# Patient Record
Sex: Female | Born: 1970 | Race: Black or African American | Hispanic: No | Marital: Married | Smoking: Never smoker
Health system: Southern US, Community
[De-identification: ages and names within clinical notes are randomized; demographics above are authoritative.]

## PROBLEM LIST (undated history)

## (undated) DIAGNOSIS — F32A Depression, unspecified: Secondary | ICD-10-CM

## (undated) DIAGNOSIS — F419 Anxiety disorder, unspecified: Secondary | ICD-10-CM

## (undated) DIAGNOSIS — F329 Major depressive disorder, single episode, unspecified: Secondary | ICD-10-CM

## (undated) DIAGNOSIS — F3181 Bipolar II disorder: Secondary | ICD-10-CM

## (undated) HISTORY — DX: Depression, unspecified: F32.A

## (undated) HISTORY — DX: Anxiety disorder, unspecified: F41.9

## (undated) HISTORY — DX: Major depressive disorder, single episode, unspecified: F32.9

## (undated) HISTORY — DX: Bipolar II disorder: F31.81

---

## 2007-10-30 ENCOUNTER — Ambulatory Visit (HOSPITAL_COMMUNITY): Admission: RE | Admit: 2007-10-30 | Discharge: 2007-10-30 | Payer: Self-pay | Admitting: Internal Medicine

## 2007-10-31 ENCOUNTER — Ambulatory Visit (HOSPITAL_COMMUNITY): Admission: RE | Admit: 2007-10-31 | Discharge: 2007-10-31 | Payer: Self-pay | Admitting: Internal Medicine

## 2009-12-01 ENCOUNTER — Ambulatory Visit (HOSPITAL_COMMUNITY): Admission: RE | Admit: 2009-12-01 | Discharge: 2009-12-01 | Payer: Self-pay | Admitting: Obstetrics and Gynecology

## 2010-05-19 ENCOUNTER — Observation Stay (HOSPITAL_COMMUNITY): Admission: AD | Admit: 2010-05-19 | Discharge: 2010-05-20 | Payer: Self-pay | Admitting: Obstetrics and Gynecology

## 2010-08-05 ENCOUNTER — Inpatient Hospital Stay (HOSPITAL_COMMUNITY): Admission: AD | Admit: 2010-08-05 | Discharge: 2010-08-05 | Payer: Self-pay | Admitting: Obstetrics and Gynecology

## 2010-08-07 ENCOUNTER — Inpatient Hospital Stay (HOSPITAL_COMMUNITY): Admission: AD | Admit: 2010-08-07 | Discharge: 2010-08-07 | Payer: Self-pay | Admitting: Obstetrics and Gynecology

## 2010-08-07 ENCOUNTER — Inpatient Hospital Stay (HOSPITAL_COMMUNITY): Admission: AD | Admit: 2010-08-07 | Discharge: 2010-08-10 | Payer: Self-pay | Admitting: Obstetrics and Gynecology

## 2010-08-10 ENCOUNTER — Encounter: Admission: RE | Admit: 2010-08-10 | Discharge: 2010-08-11 | Payer: Self-pay | Admitting: Obstetrics and Gynecology

## 2010-09-10 ENCOUNTER — Encounter
Admission: RE | Admit: 2010-09-10 | Discharge: 2010-10-10 | Payer: Self-pay | Source: Home / Self Care | Admitting: Obstetrics and Gynecology

## 2010-10-11 ENCOUNTER — Encounter
Admission: RE | Admit: 2010-10-11 | Discharge: 2010-11-10 | Payer: Self-pay | Source: Home / Self Care | Attending: Obstetrics and Gynecology | Admitting: Obstetrics and Gynecology

## 2010-11-11 ENCOUNTER — Encounter
Admission: RE | Admit: 2010-11-11 | Discharge: 2010-12-11 | Payer: Self-pay | Source: Home / Self Care | Attending: Obstetrics and Gynecology | Admitting: Obstetrics and Gynecology

## 2010-12-12 ENCOUNTER — Encounter
Admission: RE | Admit: 2010-12-12 | Discharge: 2010-12-14 | Payer: Self-pay | Source: Home / Self Care | Attending: Obstetrics and Gynecology | Admitting: Obstetrics and Gynecology

## 2011-01-12 ENCOUNTER — Encounter (HOSPITAL_COMMUNITY)
Admission: RE | Admit: 2011-01-12 | Discharge: 2011-01-12 | Disposition: A | Discharge status: Home / Self Care | Payer: Self-pay | Source: Ambulatory Visit | Attending: Obstetrics and Gynecology | Admitting: Obstetrics and Gynecology

## 2011-01-12 DIAGNOSIS — O923 Agalactia: Secondary | ICD-10-CM | POA: Insufficient documentation

## 2011-01-27 LAB — CBC
HCT: 26.1 % — ABNORMAL LOW (ref 36.0–46.0)
HCT: 33 % — ABNORMAL LOW (ref 36.0–46.0)
Hemoglobin: 10.6 g/dL — ABNORMAL LOW (ref 12.0–15.0)
Hemoglobin: 10.8 g/dL — ABNORMAL LOW (ref 12.0–15.0)
Hemoglobin: 8.7 g/dL — ABNORMAL LOW (ref 12.0–15.0)
MCH: 26.8 pg (ref 26.0–34.0)
MCH: 27.1 pg (ref 26.0–34.0)
MCHC: 32.7 g/dL (ref 30.0–36.0)
MCV: 81.2 fL (ref 78.0–100.0)
Platelets: 206 10*3/uL (ref 150–400)
RBC: 3.9 MIL/uL (ref 3.87–5.11)
RDW: 16.2 % — ABNORMAL HIGH (ref 11.5–15.5)
RDW: 16.4 % — ABNORMAL HIGH (ref 11.5–15.5)
WBC: 5.9 10*3/uL (ref 4.0–10.5)

## 2011-01-27 LAB — TYPE AND SCREEN: Weak D: NEGATIVE

## 2011-01-30 LAB — CBC
HCT: 30.2 % — ABNORMAL LOW (ref 36.0–46.0)
HCT: 33.5 % — ABNORMAL LOW (ref 36.0–46.0)
Hemoglobin: 11 g/dL — ABNORMAL LOW (ref 12.0–15.0)
MCH: 27.9 pg (ref 26.0–34.0)
MCHC: 33.2 g/dL (ref 30.0–36.0)
MCV: 83.3 fL (ref 78.0–100.0)
Platelets: 311 10*3/uL (ref 150–400)
RBC: 4.03 MIL/uL (ref 3.87–5.11)
RDW: 13.2 % (ref 11.5–15.5)
RDW: 13.4 % (ref 11.5–15.5)
WBC: 13.5 10*3/uL — ABNORMAL HIGH (ref 4.0–10.5)

## 2011-01-30 LAB — URINALYSIS, ROUTINE W REFLEX MICROSCOPIC
Glucose, UA: NEGATIVE mg/dL
Ketones, ur: 40 mg/dL — AB
Protein, ur: NEGATIVE mg/dL
Urobilinogen, UA: 0.2 mg/dL (ref 0.0–1.0)
pH: 7 (ref 5.0–8.0)

## 2011-01-30 LAB — DIFFERENTIAL
Basophils Absolute: 0 10*3/uL (ref 0.0–0.1)
Eosinophils Absolute: 0.2 10*3/uL (ref 0.0–0.7)
Lymphs Abs: 1.2 10*3/uL (ref 0.7–4.0)
Monocytes Relative: 11 % (ref 3–12)
Neutro Abs: 8.5 10*3/uL — ABNORMAL HIGH (ref 1.7–7.7)
Neutrophils Relative %: 77 % (ref 43–77)

## 2011-01-30 LAB — BASIC METABOLIC PANEL
BUN: 4 mg/dL — ABNORMAL LOW (ref 6–23)
GFR calc Af Amer: >60 mL/min (ref >60)
Glucose, Bld: 82 mg/dL (ref 70–99)
Sodium: 134 mEq/L — ABNORMAL LOW (ref 135–145)

## 2011-01-30 LAB — URINE MICROSCOPIC-ADD ON

## 2011-02-12 ENCOUNTER — Encounter (HOSPITAL_COMMUNITY)
Admission: RE | Admit: 2011-02-12 | Discharge: 2011-02-12 | Disposition: A | Discharge status: Home / Self Care | Payer: Self-pay | Source: Ambulatory Visit | Attending: Obstetrics and Gynecology | Admitting: Obstetrics and Gynecology

## 2011-02-12 DIAGNOSIS — O923 Agalactia: Secondary | ICD-10-CM | POA: Insufficient documentation

## 2011-03-15 ENCOUNTER — Encounter (HOSPITAL_COMMUNITY)
Admission: RE | Admit: 2011-03-15 | Discharge: 2011-03-15 | Disposition: A | Discharge status: Home / Self Care | Payer: Self-pay | Source: Ambulatory Visit | Attending: Obstetrics and Gynecology | Admitting: Obstetrics and Gynecology

## 2011-03-15 DIAGNOSIS — O923 Agalactia: Secondary | ICD-10-CM | POA: Insufficient documentation

## 2011-04-05 ENCOUNTER — Encounter: Payer: Self-pay | Admitting: Family Medicine

## 2011-04-05 DIAGNOSIS — Z0289 Encounter for other administrative examinations: Secondary | ICD-10-CM

## 2011-04-15 ENCOUNTER — Encounter (HOSPITAL_COMMUNITY)
Admission: RE | Admit: 2011-04-15 | Discharge: 2011-04-15 | Disposition: A | Discharge status: Home / Self Care | Payer: BC Managed Care – PPO | Source: Ambulatory Visit | Attending: Obstetrics and Gynecology | Admitting: Obstetrics and Gynecology

## 2011-04-15 DIAGNOSIS — O923 Agalactia: Secondary | ICD-10-CM | POA: Insufficient documentation

## 2011-05-03 ENCOUNTER — Ambulatory Visit (INDEPENDENT_AMBULATORY_CARE_PROVIDER_SITE_OTHER): Payer: BC Managed Care – PPO | Admitting: Family Medicine

## 2011-05-03 ENCOUNTER — Encounter: Payer: Self-pay | Admitting: Family Medicine

## 2011-05-03 VITALS — BP 134/82 | HR 80 | Temp 98.1°F | Ht 65.5 in | Wt 198.6 lb

## 2011-05-03 DIAGNOSIS — F3181 Bipolar II disorder: Secondary | ICD-10-CM | POA: Insufficient documentation

## 2011-05-03 DIAGNOSIS — R102 Pelvic and perineal pain: Secondary | ICD-10-CM

## 2011-05-03 DIAGNOSIS — N949 Unspecified condition associated with female genital organs and menstrual cycle: Secondary | ICD-10-CM

## 2011-05-03 DIAGNOSIS — E669 Obesity, unspecified: Secondary | ICD-10-CM

## 2011-05-03 DIAGNOSIS — F3189 Other bipolar disorder: Secondary | ICD-10-CM

## 2011-05-03 DIAGNOSIS — R109 Unspecified abdominal pain: Secondary | ICD-10-CM

## 2011-05-03 DIAGNOSIS — F329 Major depressive disorder, single episode, unspecified: Secondary | ICD-10-CM

## 2011-05-03 LAB — POCT URINALYSIS DIPSTICK
Bilirubin, UA: NEGATIVE
Glucose, UA: NEGATIVE
Ketones, UA: NEGATIVE
Leukocytes, UA: NEGATIVE
Nitrite, UA: NEGATIVE
Spec Grav, UA: 1.015
Urobilinogen, UA: 0.2
pH, UA: 6

## 2011-05-03 MED ORDER — SERTRALINE HCL 50 MG PO TABS: ORAL_TABLET | ORAL | Status: DC

## 2011-05-03 NOTE — Patient Instructions (Signed)

## 2011-05-03 NOTE — Progress Notes (Signed)
  Subjective:    Patient ID: Janet Parsons, female    DOB: 24-Oct-1971, 40 y.o.   MRN: [id number]  HPI Pt here to establish.  She has hx bipolar and saw presb for psych but was discharged because she got pregnant.  Pt is looking for a new psych.   Pt also c/o some low abd pain and some urinary frequency. No fevers etc.  Baby is 37 months old.   Review of Systems    as above Objective:   Physical Exam  Constitutional: She is oriented to person, place, and time. She appears well-developed and well-nourished.  Cardiovascular: Normal rate, regular rhythm and normal heart sounds.   Pulmonary/Chest: Effort normal and breath sounds normal.  Abdominal: Soft. Bowel sounds are normal.  Musculoskeletal: She exhibits no edema.  Neurological: She is alert and oriented to person, place, and time.  Skin: Skin is warm and dry.  Psychiatric: She has a normal mood and affect. Her behavior is normal. Judgment and thought content normal.          Assessment & Plan:

## 2011-05-03 NOTE — Assessment & Plan Note (Signed)
ua neg F/u gyn

## 2011-05-03 NOTE — Assessment & Plan Note (Signed)
Start zoloft 50 mg daily Make appointment with psych rto 1 month if needed

## 2011-05-16 ENCOUNTER — Encounter (HOSPITAL_COMMUNITY)
Admission: RE | Admit: 2011-05-16 | Discharge: 2011-05-16 | Disposition: A | Discharge status: Home / Self Care | Payer: BC Managed Care – PPO | Source: Ambulatory Visit | Attending: Obstetrics and Gynecology | Admitting: Obstetrics and Gynecology

## 2011-05-16 DIAGNOSIS — O923 Agalactia: Secondary | ICD-10-CM | POA: Insufficient documentation

## 2011-06-16 ENCOUNTER — Encounter (HOSPITAL_COMMUNITY)
Admission: RE | Admit: 2011-06-16 | Discharge: 2011-06-16 | Disposition: A | Discharge status: Home / Self Care | Payer: BC Managed Care – PPO | Source: Ambulatory Visit | Attending: Obstetrics and Gynecology | Admitting: Obstetrics and Gynecology

## 2011-06-16 DIAGNOSIS — O923 Agalactia: Secondary | ICD-10-CM | POA: Insufficient documentation

## 2011-07-29 ENCOUNTER — Ambulatory Visit: Payer: BC Managed Care – PPO | Admitting: *Deleted

## 2014-06-09 DIAGNOSIS — N907 Vulvar cyst: Secondary | ICD-10-CM | POA: Insufficient documentation

## 2014-06-09 DIAGNOSIS — R110 Nausea: Secondary | ICD-10-CM | POA: Insufficient documentation

## 2014-06-09 DIAGNOSIS — R7303 Prediabetes: Secondary | ICD-10-CM | POA: Insufficient documentation

## 2014-06-25 DIAGNOSIS — Z0000 Encounter for general adult medical examination without abnormal findings: Secondary | ICD-10-CM | POA: Insufficient documentation

## 2014-06-25 DIAGNOSIS — F419 Anxiety disorder, unspecified: Secondary | ICD-10-CM | POA: Insufficient documentation

## 2015-05-01 DIAGNOSIS — R3129 Other microscopic hematuria: Secondary | ICD-10-CM | POA: Insufficient documentation

## 2015-05-19 ENCOUNTER — Other Ambulatory Visit: Payer: Self-pay | Admitting: Gastroenterology

## 2015-05-19 DIAGNOSIS — R1030 Lower abdominal pain, unspecified: Secondary | ICD-10-CM

## 2015-05-22 ENCOUNTER — Ambulatory Visit
Admission: RE | Admit: 2015-05-22 | Discharge: 2015-05-22 | Disposition: A | Discharge status: Home / Self Care | Payer: BLUE CROSS/BLUE SHIELD | Source: Ambulatory Visit | Attending: Gastroenterology | Admitting: Gastroenterology

## 2015-05-22 DIAGNOSIS — R1030 Lower abdominal pain, unspecified: Secondary | ICD-10-CM

## 2015-09-30 ENCOUNTER — Other Ambulatory Visit: Payer: Self-pay | Admitting: Obstetrics and Gynecology

## 2015-09-30 DIAGNOSIS — D259 Leiomyoma of uterus, unspecified: Secondary | ICD-10-CM

## 2015-09-30 DIAGNOSIS — D219 Benign neoplasm of connective and other soft tissue, unspecified: Secondary | ICD-10-CM

## 2015-10-15 ENCOUNTER — Ambulatory Visit
Admission: RE | Admit: 2015-10-15 | Discharge: 2015-10-15 | Disposition: A | Discharge status: Home / Self Care | Payer: BLUE CROSS/BLUE SHIELD | Source: Ambulatory Visit | Attending: Obstetrics and Gynecology | Admitting: Obstetrics and Gynecology

## 2015-10-15 ENCOUNTER — Other Ambulatory Visit (HOSPITAL_COMMUNITY): Payer: Self-pay | Admitting: Interventional Radiology

## 2015-10-15 DIAGNOSIS — D259 Leiomyoma of uterus, unspecified: Secondary | ICD-10-CM

## 2015-10-15 NOTE — Consult Note (Signed)
Chief Complaint: Patient was seen in consultation today for No chief complaint on file.  at the request of Sansing,Mary  Referring Physician(s): Sansing,Mary  History of Present Illness: Janet Parsons is a 44 y.o. female with increasing pelvic cramping and menorrhagia during her menstrual cycles. She also complains of increasing bloating and frequency. This has slowly developed over the last few years. She was diagnosed with fibroids July of this year by ultrasound. She has not been on any treatment. She denies pelvic cancer or radiation. She has no future pregnancy plans. She is gravida 1 para 67 with a 21-year-old. During her cycles, heavy days last 1-4 days. She changes pads or 5 times per day. She does have some right pelvic pain and cramping between her periods but denies any intermenstrual bleeding.  Past Medical History  Diagnosis Date  . Depression   . Anxiety   . Bipolar II disorder ([place])     History reviewed. No pertinent past surgical history.  Allergies: Review of patient's allergies indicates no known allergies.  Medications: Prior to Admission medications   Medication Sig Start Date End Date Taking? Authorizing Provider  propranolol (INDERAL) 20 MG tablet Prn anxiety/palpitations 03/19/15  Yes Historical Provider, MD     Family History  Problem Relation Age of Onset  . Hypertension Mother   . Alcohol abuse Brother   . Diabetes Brother   . Drug abuse Brother   . Alcohol abuse Sister   . Hypertension Sister   . Depression Sister   . Hypertension Sister   . Drug abuse Sister   . Alcohol abuse Brother   . Depression Brother   . Drug abuse Brother   . Alcohol abuse Brother   . Drug abuse Brother     Social History   Social History  . Marital Status: Married    Spouse Name: N/A  . Number of Children: 1  . Years of Education: 20   Occupational History  . program director---[employer]    Social History Main Topics  . Smoking  status: Never Smoker   . Smokeless tobacco: Never Used  . Alcohol Use: No  . Drug Use: No  . Sexual Activity:    Partners: Male   Other Topics Concern  . None   Social History Narrative     Review of Systems: A 12 point ROS discussed and pertinent positives are indicated in the HPI above.  All other systems are negative.  Review of Systems  Vital Signs: BP 122/90 mmHg  Pulse 69  Temp(Src) 97.9 F (36.6 C)  Resp 14  SpO2 99%  LMP 10/05/2015  Physical Exam  Constitutional: She is oriented to person, place, and time. She appears well-developed and well-nourished.  Cardiovascular: Normal rate, regular rhythm and intact distal pulses.   Pulmonary/Chest: Effort normal and breath sounds normal.  Neurological: She is alert and oriented to person, place, and time.  Skin: Skin is warm and dry.  Psychiatric: She has a normal mood and affect.     Imaging: No results found.  Ultrasound in July demonstrates multiple fibroids ranging in size from 2.3-3.5 cm.  Labs:  CBC: No results for input(s): WBC, HGB, HCT, PLT in the last 8760 hours.  COAGS: No results for input(s): INR, APTT in the last 8760 hours.  BMP: No results for input(s): NA, K, CL, CO2, GLUCOSE, BUN, CALCIUM, CREATININE, GFRNONAA, GFRAA in the last 8760 hours.  Invalid input(s): CMP   She has  not had an endometrial biopsy.   Assessment and Plan:  Mr. Donnally does have significant menstrual pain as well as bleeding and secondary symptoms such as bloating and a frequency most likely secondary to uterine fibroids. Uterine fibroid embolization was discussed. The risks, benefits, and alternatives were also discussed. Her questions were answered. She will not need an endometrial biopsy because of her age and lack of intermenstrual bleeding. She will require an MRI with contrast. She wishes to proceed with this course of action. Hopefully, if she is a candidate, we will proceed with the embolization.  Thank  you for this interesting consult.  I greatly enjoyed meeting Virgilene Jackson-Diop and look forward to participating in their care.  A copy of this report was sent to the requesting provider on this date.  Signed: Reilley Valentine, ART A 10/15/2015, 10:30 AM   I spent a total of  40 Minutes   in face to face in clinical consultation, greater than 50% of which was counseling/coordinating care for uterine fibroids.

## 2015-10-21 HISTORY — PX: DENTAL SURGERY: SHX609

## 2015-10-22 ENCOUNTER — Ambulatory Visit (HOSPITAL_COMMUNITY)
Admission: RE | Admit: 2015-10-22 | Discharge: 2015-10-22 | Disposition: A | Discharge status: Home / Self Care | Payer: BLUE CROSS/BLUE SHIELD | Source: Ambulatory Visit | Attending: Interventional Radiology | Admitting: Interventional Radiology

## 2015-10-22 ENCOUNTER — Other Ambulatory Visit (HOSPITAL_COMMUNITY): Payer: BLUE CROSS/BLUE SHIELD

## 2015-10-22 DIAGNOSIS — D259 Leiomyoma of uterus, unspecified: Secondary | ICD-10-CM | POA: Diagnosis not present

## 2015-10-22 MED ORDER — GADOBENATE DIMEGLUMINE 529 MG/ML IV SOLN
20.0000 mL | Freq: Once | INTRAVENOUS | Status: AC | PRN
  Administered 2015-10-22: 18 mL via INTRAVENOUS

## 2015-11-04 HISTORY — PX: DENTAL SURGERY: SHX609

## 2015-11-10 ENCOUNTER — Other Ambulatory Visit: Payer: Self-pay | Admitting: Radiology

## 2015-11-11 ENCOUNTER — Ambulatory Visit (HOSPITAL_COMMUNITY)
Admission: RE | Admit: 2015-11-11 | Discharge: 2015-11-11 | Disposition: A | Discharge status: Home / Self Care | Payer: BLUE CROSS/BLUE SHIELD | Source: Ambulatory Visit | Attending: Interventional Radiology | Admitting: Interventional Radiology

## 2015-11-11 ENCOUNTER — Encounter (HOSPITAL_COMMUNITY): Payer: Self-pay

## 2015-11-11 ENCOUNTER — Observation Stay (HOSPITAL_COMMUNITY)
Admission: RE | Admit: 2015-11-11 | Discharge: 2015-11-12 | Disposition: A | Discharge status: Home / Self Care | Payer: BLUE CROSS/BLUE SHIELD | Source: Ambulatory Visit | Attending: Interventional Radiology | Admitting: Interventional Radiology

## 2015-11-11 DIAGNOSIS — D259 Leiomyoma of uterus, unspecified: Principal | ICD-10-CM | POA: Insufficient documentation

## 2015-11-11 DIAGNOSIS — D219 Benign neoplasm of connective and other soft tissue, unspecified: Secondary | ICD-10-CM | POA: Diagnosis present

## 2015-11-11 LAB — CBC WITH DIFFERENTIAL/PLATELET
BASOS PCT: 1 %
Basophils Absolute: 0.1 10*3/uL (ref 0.0–0.1)
Eosinophils Absolute: 0.3 10*3/uL (ref 0.0–0.7)
Eosinophils Relative: 5 %
HEMATOCRIT: 39.9 % (ref 36.0–46.0)
HEMOGLOBIN: 12.5 g/dL (ref 12.0–15.0)
LYMPHS PCT: 41 %
Lymphs Abs: 2.4 10*3/uL (ref 0.7–4.0)
MCH: 25.8 pg — ABNORMAL LOW (ref 26.0–34.0)
MCHC: 31.3 g/dL (ref 30.0–36.0)
MCV: 82.4 fL (ref 78.0–100.0)
MONO ABS: 0.3 10*3/uL (ref 0.1–1.0)
MONOS PCT: 5 %
NEUTROS ABS: 3 10*3/uL (ref 1.7–7.7)
NEUTROS PCT: 48 %
Platelets: 393 10*3/uL (ref 150–400)
RBC: 4.84 MIL/uL (ref 3.87–5.11)
RDW: 13.1 % (ref 11.5–15.5)
WBC: 6 10*3/uL (ref 4.0–10.5)

## 2015-11-11 LAB — BASIC METABOLIC PANEL
ANION GAP: 9 (ref 5–15)
BUN: 11 mg/dL (ref 6–20)
CO2: 27 mmol/L (ref 22–32)
Calcium: 9.4 mg/dL (ref 8.9–10.3)
Chloride: 104 mmol/L (ref 101–111)
Creatinine, Ser: 0.83 mg/dL (ref 0.44–1.00)
GFR calc Af Amer: >60 mL/min (ref >60)
GFR calc non Af Amer: >60 mL/min (ref >60)
Glucose, Bld: 105 mg/dL — ABNORMAL HIGH (ref 65–99)
POTASSIUM: 4.1 mmol/L (ref 3.5–5.1)
Sodium: 140 mmol/L (ref 135–145)

## 2015-11-11 LAB — PROTIME-INR
INR: 0.99 (ref 0.00–1.49)
PROTHROMBIN TIME: 13.3 s (ref 11.6–15.2)

## 2015-11-11 LAB — GLUCOSE, CAPILLARY: Glucose-Capillary: 119 mg/dL — ABNORMAL HIGH (ref 65–99)

## 2015-11-11 LAB — HCG, SERUM, QUALITATIVE: Preg, Serum: NEGATIVE

## 2015-11-11 MED ORDER — MIDAZOLAM HCL 2 MG/2ML IJ SOLN
INTRAMUSCULAR | Status: AC | PRN
  Administered 2015-11-11 (×3): 0.5 mg via INTRAVENOUS
  Administered 2015-11-11: 1 mg via INTRAVENOUS
  Administered 2015-11-11 (×4): 0.5 mg via INTRAVENOUS

## 2015-11-11 MED ORDER — SODIUM CHLORIDE 0.9 % IV SOLN
250.0000 mL | INTRAVENOUS | Status: DC | PRN
Start: 2015-11-11 — End: 2015-11-12

## 2015-11-11 MED ORDER — LIDOCAINE HCL 1 % IJ SOLN
INTRAMUSCULAR | Status: AC
  Filled 2015-11-11: qty 20

## 2015-11-11 MED ORDER — CEFAZOLIN SODIUM-DEXTROSE 2-3 GM-% IV SOLR
INTRAVENOUS | Status: AC
  Filled 2015-11-11: qty 50

## 2015-11-11 MED ORDER — CEFAZOLIN SODIUM-DEXTROSE 2-3 GM-% IV SOLR
2.0000 g | Freq: Once | INTRAVENOUS | Status: AC
  Administered 2015-11-11: 2 g via INTRAVENOUS

## 2015-11-11 MED ORDER — NITROGLYCERIN 1 MG/10 ML FOR IR/CATH LAB
INTRA_ARTERIAL | Status: AC | PRN
  Administered 2015-11-11: 100 ug via INTRA_ARTERIAL

## 2015-11-11 MED ORDER — HYDROMORPHONE 1 MG/ML IV SOLN
INTRAVENOUS | Status: AC
  Filled 2015-11-11: qty 25

## 2015-11-11 MED ORDER — PROMETHAZINE HCL 25 MG PO TABS: 25.0000 mg | ORAL_TABLET | Freq: Three times a day (TID) | ORAL | Status: DC | PRN

## 2015-11-11 MED ORDER — ONDANSETRON HCL 4 MG/2ML IJ SOLN
4.0000 mg | Freq: Four times a day (QID) | INTRAMUSCULAR | Status: DC | PRN
  Administered 2015-11-11: 4 mg via INTRAVENOUS
  Filled 2015-11-11: qty 2

## 2015-11-11 MED ORDER — HYDROMORPHONE HCL 2 MG/ML IJ SOLN
INTRAMUSCULAR | Status: AC
  Filled 2015-11-11: qty 1

## 2015-11-11 MED ORDER — KETOROLAC TROMETHAMINE 15 MG/ML IJ SOLN
15.0000 mg | Freq: Four times a day (QID) | INTRAMUSCULAR | Status: DC
  Administered 2015-11-11 – 2015-11-12 (×3): 15 mg via INTRAVENOUS
  Filled 2015-11-11 (×7): qty 1

## 2015-11-11 MED ORDER — NALOXONE HCL 0.4 MG/ML IJ SOLN: 0.4000 mg | INTRAMUSCULAR | Status: DC | PRN

## 2015-11-11 MED ORDER — MIDAZOLAM HCL 2 MG/2ML IJ SOLN
INTRAMUSCULAR | Status: AC
  Filled 2015-11-11: qty 6

## 2015-11-11 MED ORDER — DIPHENHYDRAMINE HCL 50 MG/ML IJ SOLN: 12.5000 mg | Freq: Four times a day (QID) | INTRAMUSCULAR | Status: DC | PRN

## 2015-11-11 MED ORDER — FENTANYL CITRATE (PF) 100 MCG/2ML IJ SOLN
INTRAMUSCULAR | Status: AC | PRN
  Administered 2015-11-11: 50 ug via INTRAVENOUS
  Administered 2015-11-11 (×3): 25 ug via INTRAVENOUS

## 2015-11-11 MED ORDER — KETOROLAC TROMETHAMINE 30 MG/ML IJ SOLN
30.0000 mg | Freq: Once | INTRAMUSCULAR | Status: AC
  Administered 2015-11-11: 30 mg via INTRAVENOUS
  Filled 2015-11-11: qty 1

## 2015-11-11 MED ORDER — SODIUM CHLORIDE 0.9 % IJ SOLN
3.0000 mL | Freq: Two times a day (BID) | INTRAMUSCULAR | Status: DC
Start: 2015-11-11 — End: 2015-11-12

## 2015-11-11 MED ORDER — IOHEXOL 300 MG/ML  SOLN
45.0000 mL | Freq: Once | INTRAMUSCULAR | Status: AC | PRN
Start: 2015-11-11 — End: 2015-11-11
  Administered 2015-11-11: 45 mL via INTRA_ARTERIAL

## 2015-11-11 MED ORDER — IOHEXOL 300 MG/ML  SOLN
40.0000 mL | Freq: Once | INTRAMUSCULAR | Status: AC | PRN
  Administered 2015-11-11: 40 mL via INTRA_ARTERIAL

## 2015-11-11 MED ORDER — IOHEXOL 300 MG/ML  SOLN
20.0000 mL | Freq: Once | INTRAMUSCULAR | Status: AC | PRN
  Administered 2015-11-11: 20 mL via INTRA_ARTERIAL

## 2015-11-11 MED ORDER — HYDROMORPHONE 1 MG/ML IV SOLN
INTRAVENOUS | Status: DC
  Administered 2015-11-11: 1.8 mg via INTRAVENOUS
  Administered 2015-11-11: 12:00:00 via INTRAVENOUS
  Administered 2015-11-11: 2.14 mg via INTRAVENOUS
  Administered 2015-11-12 (×2): 1.5 mg via INTRAVENOUS
  Administered 2015-11-12: 0.9 mg via INTRAVENOUS

## 2015-11-11 MED ORDER — DIPHENHYDRAMINE HCL 12.5 MG/5ML PO ELIX
12.5000 mg | ORAL_SOLUTION | Freq: Four times a day (QID) | ORAL | Status: DC | PRN
Start: 2015-11-11 — End: 2015-11-12
  Filled 2015-11-11: qty 5

## 2015-11-11 MED ORDER — SODIUM CHLORIDE 0.9 % IJ SOLN
9.0000 mL | INTRAMUSCULAR | Status: DC | PRN
Start: 2015-11-11 — End: 2015-11-12

## 2015-11-11 MED ORDER — SODIUM CHLORIDE 0.9 % IJ SOLN
3.0000 mL | INTRAMUSCULAR | Status: DC | PRN
Start: 2015-11-11 — End: 2015-11-12

## 2015-11-11 MED ORDER — DOCUSATE SODIUM 100 MG PO CAPS
100.0000 mg | ORAL_CAPSULE | Freq: Two times a day (BID) | ORAL | Status: DC
  Administered 2015-11-11 – 2015-11-12 (×2): 100 mg via ORAL
  Filled 2015-11-11 (×3): qty 1

## 2015-11-11 MED ORDER — PROMETHAZINE HCL 25 MG RE SUPP: 25.0000 mg | Freq: Three times a day (TID) | RECTAL | Status: DC | PRN

## 2015-11-11 MED ORDER — FENTANYL CITRATE (PF) 100 MCG/2ML IJ SOLN
INTRAMUSCULAR | Status: AC
  Filled 2015-11-11: qty 4

## 2015-11-11 MED ORDER — SODIUM CHLORIDE 0.9 % IV SOLN
INTRAVENOUS | Status: DC
  Administered 2015-11-11: 08:00:00 via INTRAVENOUS

## 2015-11-11 NOTE — H&P (Signed)
    HPI: This is a 44 year old female with symptomatic uterine fibroids who has been seen in full IR consult with Dr. Barbie Banner on 10/15/15 and deemed a candidate for UFE. She has been scheduled today for image guided uterine fibroid arterial embolization. She denies any current chest pain, shortness of breath, fever, chills or urinary symptoms. She does complain of lower back ache 4/10 and denies any pelvic cramping or vaginal bleeding.   The patient has had a H&P performed within the last 30 days, all history, medications, and exam have been reviewed. The patient denies any interval changes since the H&P.  Medications: Prior to Admission medications   Medication Sig Start Date End Date Taking? Authorizing Provider  ibuprofen (ADVIL,MOTRIN) 200 MG tablet Take 200 mg by mouth every 6 (six) hours as needed.   Yes Historical Provider, MD  propranolol (INDERAL) 20 MG tablet Prn anxiety/palpitations 03/19/15  Yes Historical Provider, MD  traMADol (ULTRAM) 50 MG tablet Take 50 mg by mouth every 6 (six) hours as needed.   Yes Historical Provider, MD     Vital Signs: BP 147/98 mmHg  Pulse 88  Temp(Src) 98.6 F (37 C) (Oral)  Resp 18  SpO2 95%  LMP 10/26/2015  Physical Exam  Constitutional: She is oriented to person, place, and time. No distress.  HENT:  Head: Normocephalic and atraumatic.  Cardiovascular: Normal rate and regular rhythm.  Exam reveals no gallop and no friction rub.   No murmur heard. Pulmonary/Chest: Effort normal and breath sounds normal.  Abdominal: Soft. Bowel sounds are normal. She exhibits no distension. There is no tenderness.  Neurological: She is alert and oriented to person, place, and time.  Skin: Skin is warm and dry. She is not diaphoretic.  Psychiatric: She has a normal mood and affect. Her behavior is normal. Thought content normal.    Mallampati Score:  MD Evaluation Airway: WNL Heart: WNL Abdomen: WNL Chest/ Lungs: WNL ASA  Classification:  2 Mallampati/Airway Score: Two  Labs:  CBC:  Recent Labs  11/11/15 0805  WBC 6.0  HGB 12.5  HCT 39.9  PLT 393    COAGS:  Recent Labs  11/11/15 0805  INR 0.99    BMP:  Recent Labs  11/11/15 0805  NA 140  K 4.1  CL 104  CO2 27  GLUCOSE 105*  BUN 11  CALCIUM 9.4  CREATININE 0.83  GFRNONAA >60  GFRAA >60    Assessment/Plan:  Symptomatic uterine fibroids  Seen in full IR consult with Dr. Barbie Banner on 10/15/15 MRI reviewed, no endometrial biopsy needed per Dr. Barbie Banner secondary to age and lack of intermenstrual bleeding, no recent PAP done- scheduled for PAP in next few weeks per patient, Dr. Barbie Banner aware and ok to proceed today Scheduled today for image guided uterine fibroid arterial embolization with sedation and overnight admission The patient has been NPO, no blood thinners taken, labs and vitals have been reviewed. Risks and Benefits discussed with the patient including, but not limited to bleeding, infection, vascular injury, contrast induced renal failure and post embolization syndrome. All of the patient's questions were answered, patient is agreeable to proceed. Consent signed and in chart.   SignedHedy Jacob 11/11/2015, 9:43 AM

## 2015-11-11 NOTE — Progress Notes (Signed)
Subjective: Patient complains of 6/10 lower pelvic pain/cramping. She does c/o some nausea which zofran has helped.   Allergies: Review of patient's allergies indicates no known allergies.  Medications: Prior to Admission medications   Medication Sig Start Date End Date Taking? Authorizing Provider  chlorhexidine (PERIDEX) 0.12 % solution SWISH WITH 1/2 CAPFUL BEFORE BEDTIME AND EXPECTORATE. START 24 H AFTER SURGERY 10/21/15  Yes Historical Provider, MD  doxycycline (VIBRAMYCIN) 100 MG capsule Take 1 capsule by mouth daily. 11/04/15  Yes Historical Provider, MD  ibuprofen (ADVIL,MOTRIN) 200 MG tablet Take 200 mg by mouth every 6 (six) hours as needed for moderate pain.    Yes Historical Provider, MD  propranolol (INDERAL) 20 MG tablet Take one tablet daily as needed for palpitations/anxiety 03/19/15  Yes Historical Provider, MD  traMADol (ULTRAM) 50 MG tablet Take 50 mg by mouth every 6 (six) hours as needed.   Yes Historical Provider, MD   Vital Signs: BP 110/69 mmHg  Pulse 67  Temp(Src) 97.6 F (36.4 C) (Oral)  Resp 12  SpO2 99%  LMP 10/26/2015  Physical Exam General: Awake, oriented, NAD Abd: Soft, TTP lower pelvic region Ext: RCFA access dressing C/D/I, Soft, NT, no signs of bleeding or hematoma, DP 1+ b/l, feet warm b/l  Imaging: Ir Angiogram Pelvis Selective Or Supraselective  11/11/2015  CLINICAL DATA:  Uterine fibroids. EXAM: UTERINE ARTERY EMBOLIZATION FLUOROSCOPY TIME:  24 minutes and 30 seconds. MEDICATIONS AND MEDICAL HISTORY: Versed 4.5 mg, Fentanyl 125 mcg. As antibiotic prophylaxis, Ancef was ordered pre-procedure and administered intravenously within one hour of incision. Allergies: None ANESTHESIA/SEDATION: Moderate sedation time: 90 minutes CONTRAST:  105 cc Omnipaque 300 PROCEDURE: Vessels selected:  Third order iliac bilateral. The procedure, risks, benefits, and alternatives were explained to the patient. Questions regarding the procedure were encouraged and  answered. The patient understands and consents to the procedure. The right groin was prepped with Betadine in a sterile fashion, and a sterile drape was applied covering the operative field. A sterile gown and sterile gloves were used for the procedure. A micropuncture needle was inserted into the right common femoral artery and removed over an 018 wire which was up sized to a [employer]. A 5-French sheath was inserted. Cobra II catheter was advanced into the aorta. The contralateral left common iliac artery was selected. The internal iliac artery on the left was selected. 3D Angiography was performed. A micro catheter was advanced over an 018 glide wire into the left uterine artery. Angiography was performed. Embolization was performed utilizing 2 vials 500-700 micron embospheres. Follow-up angiogram was performed after waiting for 5 minutes. The micro catheter was removed and flushed. A Waltman's loop was created. The ipsilateral right common iliac artery and right internal iliac artery were selected. Angiography was performed. A micro catheter was advanced over a 0018 glide wire into the right uterine artery. 3D Angiography was performed. Embolization was again performed with 1-2/3 vial 500-700 micron embospheres. Follow-up angiogram was performed after waiting 5 minutes. The micro catheter and Cobra catheter were removed. Right femoral angiography was performed. An Exoseal device was deployed without complication and hemostasis was achieved. FINDINGS: Imaging demonstrates bilateral pelvic anatomy and bilateral uterine artery angiography. Expected anatomy is identified. No blood supply from the uterine artery to the vagina is visualized. Post embolization angiography demonstrates relative stasis of flow and pruning of the vasculature to the uterus. COMPLICATIONS: None IMPRESSION: Successful bilateral uterine artery embolization. Electronically Signed   By: Marybelle Killings M.D.   On: 11/11/2015 12:36  Ir Angiogram  Pelvis Selective Or Supraselective  11/11/2015  CLINICAL DATA:  Uterine fibroids. EXAM: UTERINE ARTERY EMBOLIZATION FLUOROSCOPY TIME:  24 minutes and 30 seconds. MEDICATIONS AND MEDICAL HISTORY: Versed 4.5 mg, Fentanyl 125 mcg. As antibiotic prophylaxis, Ancef was ordered pre-procedure and administered intravenously within one hour of incision. Allergies: None ANESTHESIA/SEDATION: Moderate sedation time: 90 minutes CONTRAST:  105 cc Omnipaque 300 PROCEDURE: Vessels selected:  Third order iliac bilateral. The procedure, risks, benefits, and alternatives were explained to the patient. Questions regarding the procedure were encouraged and answered. The patient understands and consents to the procedure. The right groin was prepped with Betadine in a sterile fashion, and a sterile drape was applied covering the operative field. A sterile gown and sterile gloves were used for the procedure. A micropuncture needle was inserted into the right common femoral artery and removed over an 018 wire which was up sized to a [place]. A 5-French sheath was inserted. Cobra II catheter was advanced into the aorta. The contralateral left common iliac artery was selected. The internal iliac artery on the left was selected. 3D Angiography was performed. A micro catheter was advanced over an 018 glide wire into the left uterine artery. Angiography was performed. Embolization was performed utilizing 2 vials 500-700 micron embospheres. Follow-up angiogram was performed after waiting for 5 minutes. The micro catheter was removed and flushed. A Waltman's loop was created. The ipsilateral right common iliac artery and right internal iliac artery were selected. Angiography was performed. A micro catheter was advanced over a 0018 glide wire into the right uterine artery. 3D Angiography was performed. Embolization was again performed with 1-2/3 vial 500-700 micron embospheres. Follow-up angiogram was performed after waiting 5 minutes. The micro  catheter and Cobra catheter were removed. Right femoral angiography was performed. An Exoseal device was deployed without complication and hemostasis was achieved. FINDINGS: Imaging demonstrates bilateral pelvic anatomy and bilateral uterine artery angiography. Expected anatomy is identified. No blood supply from the uterine artery to the vagina is visualized. Post embolization angiography demonstrates relative stasis of flow and pruning of the vasculature to the uterus. COMPLICATIONS: None IMPRESSION: Successful bilateral uterine artery embolization. Electronically Signed   By: Marybelle Killings M.D.   On: 11/11/2015 12:36   Ir Angiogram Selective Each Additional Vessel  11/11/2015  CLINICAL DATA:  Uterine fibroids. EXAM: UTERINE ARTERY EMBOLIZATION FLUOROSCOPY TIME:  24 minutes and 30 seconds. MEDICATIONS AND MEDICAL HISTORY: Versed 4.5 mg, Fentanyl 125 mcg. As antibiotic prophylaxis, Ancef was ordered pre-procedure and administered intravenously within one hour of incision. Allergies: None ANESTHESIA/SEDATION: Moderate sedation time: 90 minutes CONTRAST:  105 cc Omnipaque 300 PROCEDURE: Vessels selected:  Third order iliac bilateral. The procedure, risks, benefits, and alternatives were explained to the patient. Questions regarding the procedure were encouraged and answered. The patient understands and consents to the procedure. The right groin was prepped with Betadine in a sterile fashion, and a sterile drape was applied covering the operative field. A sterile gown and sterile gloves were used for the procedure. A micropuncture needle was inserted into the right common femoral artery and removed over an 018 wire which was up sized to a [place]. A 5-French sheath was inserted. Cobra II catheter was advanced into the aorta. The contralateral left common iliac artery was selected. The internal iliac artery on the left was selected. 3D Angiography was performed. A micro catheter was advanced over an 018 glide wire  into the left uterine artery. Angiography was performed. Embolization was performed utilizing 2 vials 500-700  micron embospheres. Follow-up angiogram was performed after waiting for 5 minutes. The micro catheter was removed and flushed. A Waltman's loop was created. The ipsilateral right common iliac artery and right internal iliac artery were selected. Angiography was performed. A micro catheter was advanced over a 0018 glide wire into the right uterine artery. 3D Angiography was performed. Embolization was again performed with 1-2/3 vial 500-700 micron embospheres. Follow-up angiogram was performed after waiting 5 minutes. The micro catheter and Cobra catheter were removed. Right femoral angiography was performed. An Exoseal device was deployed without complication and hemostasis was achieved. FINDINGS: Imaging demonstrates bilateral pelvic anatomy and bilateral uterine artery angiography. Expected anatomy is identified. No blood supply from the uterine artery to the vagina is visualized. Post embolization angiography demonstrates relative stasis of flow and pruning of the vasculature to the uterus. COMPLICATIONS: None IMPRESSION: Successful bilateral uterine artery embolization. Electronically Signed   By: Marybelle Killings M.D.   On: 11/11/2015 12:36   Ir Angiogram Selective Each Additional Vessel  11/11/2015  CLINICAL DATA:  Uterine fibroids. EXAM: UTERINE ARTERY EMBOLIZATION FLUOROSCOPY TIME:  24 minutes and 30 seconds. MEDICATIONS AND MEDICAL HISTORY: Versed 4.5 mg, Fentanyl 125 mcg. As antibiotic prophylaxis, Ancef was ordered pre-procedure and administered intravenously within one hour of incision. Allergies: None ANESTHESIA/SEDATION: Moderate sedation time: 90 minutes CONTRAST:  105 cc Omnipaque 300 PROCEDURE: Vessels selected:  Third order iliac bilateral. The procedure, risks, benefits, and alternatives were explained to the patient. Questions regarding the procedure were encouraged and answered. The  patient understands and consents to the procedure. The right groin was prepped with Betadine in a sterile fashion, and a sterile drape was applied covering the operative field. A sterile gown and sterile gloves were used for the procedure. A micropuncture needle was inserted into the right common femoral artery and removed over an 018 wire which was up sized to a [place]. A 5-French sheath was inserted. Cobra II catheter was advanced into the aorta. The contralateral left common iliac artery was selected. The internal iliac artery on the left was selected. 3D Angiography was performed. A micro catheter was advanced over an 018 glide wire into the left uterine artery. Angiography was performed. Embolization was performed utilizing 2 vials 500-700 micron embospheres. Follow-up angiogram was performed after waiting for 5 minutes. The micro catheter was removed and flushed. A Waltman's loop was created. The ipsilateral right common iliac artery and right internal iliac artery were selected. Angiography was performed. A micro catheter was advanced over a 0018 glide wire into the right uterine artery. 3D Angiography was performed. Embolization was again performed with 1-2/3 vial 500-700 micron embospheres. Follow-up angiogram was performed after waiting 5 minutes. The micro catheter and Cobra catheter were removed. Right femoral angiography was performed. An Exoseal device was deployed without complication and hemostasis was achieved. FINDINGS: Imaging demonstrates bilateral pelvic anatomy and bilateral uterine artery angiography. Expected anatomy is identified. No blood supply from the uterine artery to the vagina is visualized. Post embolization angiography demonstrates relative stasis of flow and pruning of the vasculature to the uterus. COMPLICATIONS: None IMPRESSION: Successful bilateral uterine artery embolization. Electronically Signed   By: Marybelle Killings M.D.   On: 11/11/2015 12:36   Ir 3d Primitivo Gauze  Darreld Mclean  11/11/2015  CLINICAL DATA:  Uterine fibroids. EXAM: UTERINE ARTERY EMBOLIZATION FLUOROSCOPY TIME:  24 minutes and 30 seconds. MEDICATIONS AND MEDICAL HISTORY: Versed 4.5 mg, Fentanyl 125 mcg. As antibiotic prophylaxis, Ancef was ordered pre-procedure and administered intravenously within one hour of incision.  Allergies: None ANESTHESIA/SEDATION: Moderate sedation time: 90 minutes CONTRAST:  105 cc Omnipaque 300 PROCEDURE: Vessels selected:  Third order iliac bilateral. The procedure, risks, benefits, and alternatives were explained to the patient. Questions regarding the procedure were encouraged and answered. The patient understands and consents to the procedure. The right groin was prepped with Betadine in a sterile fashion, and a sterile drape was applied covering the operative field. A sterile gown and sterile gloves were used for the procedure. A micropuncture needle was inserted into the right common femoral artery and removed over an 018 wire which was up sized to a [place]. A 5-French sheath was inserted. Cobra II catheter was advanced into the aorta. The contralateral left common iliac artery was selected. The internal iliac artery on the left was selected. 3D Angiography was performed. A micro catheter was advanced over an 018 glide wire into the left uterine artery. Angiography was performed. Embolization was performed utilizing 2 vials 500-700 micron embospheres. Follow-up angiogram was performed after waiting for 5 minutes. The micro catheter was removed and flushed. A Waltman's loop was created. The ipsilateral right common iliac artery and right internal iliac artery were selected. Angiography was performed. A micro catheter was advanced over a 0018 glide wire into the right uterine artery. 3D Angiography was performed. Embolization was again performed with 1-2/3 vial 500-700 micron embospheres. Follow-up angiogram was performed after waiting 5 minutes. The micro catheter and Cobra catheter  were removed. Right femoral angiography was performed. An Exoseal device was deployed without complication and hemostasis was achieved. FINDINGS: Imaging demonstrates bilateral pelvic anatomy and bilateral uterine artery angiography. Expected anatomy is identified. No blood supply from the uterine artery to the vagina is visualized. Post embolization angiography demonstrates relative stasis of flow and pruning of the vasculature to the uterus. COMPLICATIONS: None IMPRESSION: Successful bilateral uterine artery embolization. Electronically Signed   By: Marybelle Killings M.D.   On: 11/11/2015 12:36   Ir US Guide Vasc Access Right  11/11/2015  CLINICAL DATA:  Uterine fibroids. EXAM: UTERINE ARTERY EMBOLIZATION FLUOROSCOPY TIME:  24 minutes and 30 seconds. MEDICATIONS AND MEDICAL HISTORY: Versed 4.5 mg, Fentanyl 125 mcg. As antibiotic prophylaxis, Ancef was ordered pre-procedure and administered intravenously within one hour of incision. Allergies: None ANESTHESIA/SEDATION: Moderate sedation time: 90 minutes CONTRAST:  105 cc Omnipaque 300 PROCEDURE: Vessels selected:  Third order iliac bilateral. The procedure, risks, benefits, and alternatives were explained to the patient. Questions regarding the procedure were encouraged and answered. The patient understands and consents to the procedure. The right groin was prepped with Betadine in a sterile fashion, and a sterile drape was applied covering the operative field. A sterile gown and sterile gloves were used for the procedure. A micropuncture needle was inserted into the right common femoral artery and removed over an 018 wire which was up sized to a [place]. A 5-French sheath was inserted. Cobra II catheter was advanced into the aorta. The contralateral left common iliac artery was selected. The internal iliac artery on the left was selected. 3D Angiography was performed. A micro catheter was advanced over an 018 glide wire into the left uterine artery. Angiography was  performed. Embolization was performed utilizing 2 vials 500-700 micron embospheres. Follow-up angiogram was performed after waiting for 5 minutes. The micro catheter was removed and flushed. A Waltman's loop was created. The ipsilateral right common iliac artery and right internal iliac artery were selected. Angiography was performed. A micro catheter was advanced over a 0018 glide wire into the right  uterine artery. 3D Angiography was performed. Embolization was again performed with 1-2/3 vial 500-700 micron embospheres. Follow-up angiogram was performed after waiting 5 minutes. The micro catheter and Cobra catheter were removed. Right femoral angiography was performed. An Exoseal device was deployed without complication and hemostasis was achieved. FINDINGS: Imaging demonstrates bilateral pelvic anatomy and bilateral uterine artery angiography. Expected anatomy is identified. No blood supply from the uterine artery to the vagina is visualized. Post embolization angiography demonstrates relative stasis of flow and pruning of the vasculature to the uterus. COMPLICATIONS: None IMPRESSION: Successful bilateral uterine artery embolization. Electronically Signed   By: Marybelle Killings M.D.   On: 11/11/2015 12:36   Ir Embo Tumor Organ Ischemia Infarct Inc Guide Roadmapping  11/11/2015  CLINICAL DATA:  Uterine fibroids. EXAM: UTERINE ARTERY EMBOLIZATION FLUOROSCOPY TIME:  24 minutes and 30 seconds. MEDICATIONS AND MEDICAL HISTORY: Versed 4.5 mg, Fentanyl 125 mcg. As antibiotic prophylaxis, Ancef was ordered pre-procedure and administered intravenously within one hour of incision. Allergies: None ANESTHESIA/SEDATION: Moderate sedation time: 90 minutes CONTRAST:  105 cc Omnipaque 300 PROCEDURE: Vessels selected:  Third order iliac bilateral. The procedure, risks, benefits, and alternatives were explained to the patient. Questions regarding the procedure were encouraged and answered. The patient understands and consents to  the procedure. The right groin was prepped with Betadine in a sterile fashion, and a sterile drape was applied covering the operative field. A sterile gown and sterile gloves were used for the procedure. A micropuncture needle was inserted into the right common femoral artery and removed over an 018 wire which was up sized to a [place]. A 5-French sheath was inserted. Cobra II catheter was advanced into the aorta. The contralateral left common iliac artery was selected. The internal iliac artery on the left was selected. 3D Angiography was performed. A micro catheter was advanced over an 018 glide wire into the left uterine artery. Angiography was performed. Embolization was performed utilizing 2 vials 500-700 micron embospheres. Follow-up angiogram was performed after waiting for 5 minutes. The micro catheter was removed and flushed. A Waltman's loop was created. The ipsilateral right common iliac artery and right internal iliac artery were selected. Angiography was performed. A micro catheter was advanced over a 0018 glide wire into the right uterine artery. 3D Angiography was performed. Embolization was again performed with 1-2/3 vial 500-700 micron embospheres. Follow-up angiogram was performed after waiting 5 minutes. The micro catheter and Cobra catheter were removed. Right femoral angiography was performed. An Exoseal device was deployed without complication and hemostasis was achieved. FINDINGS: Imaging demonstrates bilateral pelvic anatomy and bilateral uterine artery angiography. Expected anatomy is identified. No blood supply from the uterine artery to the vagina is visualized. Post embolization angiography demonstrates relative stasis of flow and pruning of the vasculature to the uterus. COMPLICATIONS: None IMPRESSION: Successful bilateral uterine artery embolization. Electronically Signed   By: Marybelle Killings M.D.   On: 11/11/2015 12:36    Labs:  CBC:  Recent Labs  11/11/15 0805  WBC 6.0  HGB  12.5  HCT 39.9  PLT 393    COAGS:  Recent Labs  11/11/15 0805  INR 0.99    BMP:  Recent Labs  11/11/15 0805  NA 140  K 4.1  CL 104  CO2 27  GLUCOSE 105*  BUN 11  CALCIUM 9.4  CREATININE 0.83  GFRNONAA >60  GFRAA >60    Assessment and Plan: Symptomatic uterine fibroids S/p successful bilateral uterine artery embolization today Admit overnight for observation and pain control  D/C foley as ordered Discharge in am if stable   Signed: Hedy Jacob 11/11/2015, 4:14 PM

## 2015-11-11 NOTE — Sedation Documentation (Signed)
5Fr sheath was removed from right fem artery by Dr. Barbie Banner. Hemostasis achieved using exoseal closure devise and manual pressure held by Dr. Barbie Banner for 5 minutes. RDP +2, RPT +2, groin level 0.

## 2015-11-11 NOTE — Procedures (Signed)
B UAE No comp/EBL 

## 2015-11-11 NOTE — Discharge Instructions (Signed)
Uterine Artery Embolization for Fibroids, Care After °Refer to this sheet in the next few weeks. These instructions provide you with information on caring for yourself after your procedure. Your health care provider may also give you more specific instructions. Your treatment has been planned according to current medical practices, but problems sometimes occur. Call your health care provider if you have any problems or questions after your procedure. °WHAT TO EXPECT AFTER THE PROCEDURE °After your procedure, it is typical to have cramping in the pelvis. You will be given pain medicine to control it. °HOME CARE INSTRUCTIONS °· Only take over-the-counter or prescription medicines for pain, discomfort, or fever as directed by your health care provider. °· Do not take aspirin. It can cause bleeding. °· Follow your health care provider's advice regarding medicines given to you, diet, activity, and when to begin sexual activity. °· See your health care provider for follow-up care as directed. °SEEK MEDICAL CARE IF: °· You have a fever. °· You have redness, swelling, and pain around your incision site. °· You have pus draining from your incision. °· You have a rash. °SEEK IMMEDIATE MEDICAL CARE IF: °· You have bleeding from your incision site. °· You have difficulty breathing. °· You have chest pain. °· You have severe abdominal pain. °· You have leg pain. °· You become dizzy and faint. °  °This information is not intended to replace advice given to you by your health care provider. Make sure you discuss any questions you have with your health care provider. °  °Document Released: 08/21/2013 Document Reviewed: 08/21/2013 °Elsevier Interactive Patient Education ©2016 Else[employer] Inc[employer] °Uterine Artery Embolization for Fibroids °Uterine artery embolization is a nonsurgical treatment to shrink fibroids. A thin plastic tube (catheter) is used to inject material that blocks off the blood supply to the fibroid, which causes the  fibroid to shrink. °LET YOUR HEALTH CARE PROVIDER KNOW ABOUT: °· Any allergies you have. °· All medicines you are taking, including vitamins, herbs, eye drops, creams, and over-the-counter medicines. °· Previous problems you or members of your family have had with the use of anesthetics. °· Any blood disorders you have. °· Previous surgeries you have had. °· Medical conditions you have. °RISKS AND COMPLICATIONS °· Injury to the uterus from decreased blood supply °· Infection. °· Blood infection (septicemia). °· Lack of menstrual periods (amenorrhea). °· Death of tissue cells (necrosis) around your bladder or vulva. °· Development of a hole between organs or from an organ to the surface of your skin (fistula). °· Blood clot in the legs (deep vein thrombosis) or lung (pulmonary embolus). °BEFORE THE PROCEDURE °· Ask your health care provider about changing or stopping your regular medicines.   °· Do not take aspirin or blood thinners (anticoagulants) for 1 week before the surgery or as directed by your health care provider. °· Do not eat or drink anything for 8 hours before the surgery or as directed by your health care provider.   °· Empty your bladder before the procedure begins. °PROCEDURE  °· An IV tube will be placed into one of your veins. This will be used to give you a sedative and pain medication (conscious sedation). °· You will be given a medicine that numbs the area (local anesthetic). °· A small cut will be made in your groin. A catheter is then inserted into the main artery of your leg. °· The catheter will be guided through the artery to your uterus. A series of images will be taken while dye is injected   through the catheter in your groin. X-rays are taken at the same time. This is done to provide a road map of the blood supply to your uterus and fibroids. °· Tiny plastic spheres, about the size of sand grains, will be injected through the catheter. Metal coils may be used to help block the artery. The  particles will lodge in tiny branches of the uterine artery that supplies blood to the fibroids. °· The procedure is repeated on the artery that supplies the other side of the uterus. °· The catheter is then removed and pressure is held to stop any bleeding. No stitches are needed. °· A dressing is then placed over the cut (incision). °AFTER THE PROCEDURE °· You will be taken to a recovery area where your progress will be monitored until you are awake, stable, and taking fluids well. If there are no other problems, you will then be moved to a regular hospital room. °· You will be observed overnight in the hospital. °· You will have cramping that should be controlled with pain medication. °  °This information is not intended to replace advice given to you by your health care provider. Make sure you discuss any questions you have with your health care provider. °  °Document Released: 01/16/2006 Document Revised: 08/21/2013 Document Reviewed: 05/16/2013 °Elsevier Interactive Patient Education ©2016 Elsevier Inc. ° °

## 2015-11-12 ENCOUNTER — Other Ambulatory Visit: Payer: Self-pay | Admitting: Radiology

## 2015-11-12 DIAGNOSIS — D259 Leiomyoma of uterus, unspecified: Secondary | ICD-10-CM | POA: Diagnosis not present

## 2015-11-12 MED ORDER — MAGNESIUM HYDROXIDE 400 MG/5ML PO SUSP
15.0000 mL | Freq: Every day | ORAL | Status: DC | PRN
  Administered 2015-11-12: 15 mL via ORAL
  Filled 2015-11-12: qty 30

## 2015-11-12 MED ORDER — ONDANSETRON HCL 4 MG PO TABS: 4.0000 mg | ORAL_TABLET | Freq: Three times a day (TID) | ORAL | Status: AC | PRN

## 2015-11-12 MED ORDER — DOCUSATE SODIUM 100 MG PO CAPS: 100.0000 mg | ORAL_CAPSULE | Freq: Two times a day (BID) | ORAL | Status: AC

## 2015-11-12 MED ORDER — HYDROCODONE-ACETAMINOPHEN 5-325 MG PO TABS: 1.0000 | ORAL_TABLET | ORAL | Status: AC | PRN

## 2015-11-12 MED ORDER — IBUPROFEN 600 MG PO TABS: 600.0000 mg | ORAL_TABLET | Freq: Four times a day (QID) | ORAL | Status: AC | PRN

## 2015-11-12 MED ORDER — MAGNESIUM HYDROXIDE 400 MG/5ML PO SUSP
15.0000 mL | Freq: Every day | ORAL | Status: AC | PRN
Start: 2015-11-12 — End: ?

## 2015-11-12 MED ORDER — HYDROCODONE-ACETAMINOPHEN 5-325 MG PO TABS
1.0000 | ORAL_TABLET | ORAL | Status: DC | PRN
  Administered 2015-11-12: 1 via ORAL
  Filled 2015-11-12: qty 1

## 2015-11-12 NOTE — Discharge Summary (Signed)
Patient ID: Janet Parsons MRN: [id number] DOB/AGE: May 01, 1971 44 y.o.  Admit date: 11/11/2015 Discharge date: 11/12/2015  Admission Diagnoses: Symptomatic uterine fibroids   Discharge Diagnoses:  Active Problems:   Fibroids S/p successful uterine fibroid arterial embolization  Discharged Condition: Good, stable.   Hospital Course: Janet Parsons is a 44 y.o. female with increasing pelvic cramping and menorrhagia during her menstrual cycles. She also complains of increasing bloating and frequency. This has slowly developed over the last few years. She was diagnosed with fibroids July of this year by ultrasound. She was seen in full IR consult on 10/15/15 and deemed a candidate for uterine fibroid arterial embolization.   She underwent successful bilateral uterine artery embolization on 11/11/15 without any complications. She was admitted overnight for observation and pain control. Her vitals remained stable and she is afebrile. She has had her foley catheter removed and has urinated on her own without difficulty. She has ambulated without lightheadedness or dizziness. She denies any chest pain or shortness of breath. She denies any right groin access site pain, swelling or bleeding. She has tolerated a regular diet and oral medication without any current complaints of pelvic pain. She states she is ready for discharge home. She has been seen and examined and deemed medically stable for discharge. She was given the below instructions and prescriptions and verbalizes understanding.   Treatments: Successful uterine fibroid arterial embolization  Discharge Exam: Blood pressure 126/92, pulse 72, temperature 97.7 F (36.5 C), temperature source Oral, resp. rate 18, height 5\' 4"  (1.626 m), weight 192 lb (87.091 kg), last menstrual period 10/26/2015, SpO2 100 %.  Physical Exam: General: A&Ox3, NAD Heart: RRR without M/G/R Lungs: CTA b/l Abd: Soft, NT, ND, (+) BS Ext: RCFA access  dressing C/D/I, soft, NT, no signs of bleeding or hematoma, DP 1+ b/l, LE warm b/l, no edema  Disposition:   Discharge Instructions    Call MD for:  difficulty breathing, headache or visual disturbances    Complete by:  As directed      Call MD for:  extreme fatigue    Complete by:  As directed      Call MD for:  hives    Complete by:  As directed      Call MD for:  persistant dizziness or light-headedness    Complete by:  As directed      Call MD for:  persistant nausea and vomiting    Complete by:  As directed      Call MD for:  redness, tenderness, or signs of infection (pain, swelling, redness, odor or green/yellow discharge around incision site)    Complete by:  As directed      Call MD for:  severe uncontrolled pain    Complete by:  As directed      Call MD for:  temperature >100.4    Complete by:  As directed      Diet - low sodium heart healthy    Complete by:  As directed      Driving Restrictions    Complete by:  As directed   No driving x 2 days     Increase activity slowly    Complete by:  As directed      Lifting restrictions    Complete by:  As directed   No lifting over 20 lbs x 3 days     Remove dressing in 24 hours    Complete by:  As directed  Medication List    TAKE these medications        chlorhexidine 0.12 % solution  Commonly known as:  PERIDEX  SWISH WITH 1/2 CAPFUL BEFORE BEDTIME AND EXPECTORATE. START 24 H AFTER SURGERY     docusate sodium 100 MG capsule  Commonly known as:  COLACE  Take 1 capsule (100 mg total) by mouth 2 (two) times daily.     doxycycline 100 MG capsule  Commonly known as:  VIBRAMYCIN  Take 1 capsule by mouth daily.     HYDROcodone-acetaminophen 5-325 MG tablet  Commonly known as:  NORCO/VICODIN  Take 1-2 tablets by mouth every 4 (four) hours as needed for moderate pain.     ibuprofen 600 MG tablet  Commonly known as:  ADVIL,MOTRIN  Take 1 tablet (600 mg total) by mouth every 6 (six) hours as needed for  moderate pain or cramping.     magnesium hydroxide 400 MG/5ML suspension  Commonly known as:  MILK OF MAGNESIA  Take 15 mLs by mouth daily as needed for mild constipation.     ondansetron 4 MG tablet  Commonly known as:  ZOFRAN  Take 1 tablet (4 mg total) by mouth every 8 (eight) hours as needed for nausea or vomiting.     propranolol 20 MG tablet  Commonly known as:  INDERAL  Take one tablet daily as needed for palpitations/anxiety     traMADol 50 MG tablet  Commonly known as:  ULTRAM  Take 50 mg by mouth every 6 (six) hours as needed.           Follow-up Information    Follow up with HOSS, ART A, MD.   Specialty:  Interventional Radiology   Why:  Our office will call with appointment [phone]   Contact information:   [address] [place] [address] [id number]        Signed: Hedy Jacob 11/12/2015, 11:06 AM   I have spent Greater Than 30 Minutes discharging [place].

## 2015-11-12 NOTE — Progress Notes (Signed)
Assessment unchanged. Pt verbalized understanding of dc instructions through teach back regarding follow up care and when to call the doctor. Scripts x 5 given as provided by Gus Height.  Discharged via wc to front entrance to meet awaiting vehicle to carry home. Accompanied by husband and NT.

## 2015-11-14 ENCOUNTER — Telehealth: Payer: Self-pay | Admitting: Radiology

## 2015-11-14 NOTE — Progress Notes (Signed)
Pt called last pm and talked with Dr Kathlene Cote Having a  probable reaction to Vicodin Noted itching and shortness of breath Pt admits sob my have been more panic  Advised to stop Vicodin per Dr Kathlene Cote Has been using Ibuprofen 600 mg 4x/day Has Tramadol at home from a previous procedure-- if needed  Feels so much better today All is well Will start walking for exercise today Does complain of constipation Walking will help Push fluids.....  She is to call us if she needs anything

## 2015-11-23 ENCOUNTER — Other Ambulatory Visit (HOSPITAL_COMMUNITY): Payer: Self-pay | Admitting: Interventional Radiology

## 2015-11-23 DIAGNOSIS — D259 Leiomyoma of uterus, unspecified: Secondary | ICD-10-CM

## 2015-12-15 ENCOUNTER — Ambulatory Visit
Admission: RE | Admit: 2015-12-15 | Discharge: 2015-12-15 | Disposition: A | Discharge status: Home / Self Care | Payer: BLUE CROSS/BLUE SHIELD | Source: Ambulatory Visit | Attending: Interventional Radiology | Admitting: Interventional Radiology

## 2015-12-15 DIAGNOSIS — D259 Leiomyoma of uterus, unspecified: Secondary | ICD-10-CM

## 2015-12-15 HISTORY — PX: IR GENERIC HISTORICAL: IMG1180011

## 2015-12-15 NOTE — Progress Notes (Signed)
Chief Complaint: Patient was seen in consultation today for No chief complaint on file.  at the request of Graylon Amory  Referring Physician(s): Johndaniel Catlin  History of Present Illness: Janet Parsons is a 45 y.o. female who is 4 weeks status post uterine artery embolization. During her overnight hospital extended outpatient stay, her course was uneventful. She did experience an allergic reaction to Vicodin after discharge which soon resolved. She has had little to no menstrual bleeding since her discharge. Her pain was minimal, lasting 3-4 days. She has been feeling well and has no present complaints other than continued constipation. She denies any fevers or chills. She denies any foul discharge. She does express a little discomfort in her right groin at her angiogram site.  Past Medical History  Diagnosis Date  . Depression   . Anxiety   . Bipolar II disorder [facility])     Past Surgical History  Procedure Laterality Date  . Dental surgery Left 10/21/2015  . Dental surgery Right 11/04/15    Allergies: Review of patient's allergies indicates no known allergies.  Medications: Prior to Admission medications   Medication Sig Start Date End Date Taking? Authorizing Provider  chlorhexidine (PERIDEX) 0.12 % solution SWISH WITH 1/2 CAPFUL BEFORE BEDTIME AND EXPECTORATE. START 24 H AFTER SURGERY 10/21/15   Historical Provider, MD  docusate sodium (COLACE) 100 MG capsule Take 1 capsule (100 mg total) by mouth 2 (two) times daily. 11/12/15   Hedy Jacob, PA-C  doxycycline (VIBRAMYCIN) 100 MG capsule Take 1 capsule by mouth daily. 11/04/15   Historical Provider, MD  HYDROcodone-acetaminophen (NORCO/VICODIN) 5-325 MG tablet Take 1-2 tablets by mouth every 4 (four) hours as needed for moderate pain. 11/12/15   Hedy Jacob, PA-C  ibuprofen (ADVIL,MOTRIN) 600 MG tablet Take 1 tablet (600 mg total) by mouth every 6 (six) hours as needed for moderate pain or cramping. 11/12/15   Hedy Jacob, PA-C  magnesium hydroxide (MILK OF MAGNESIA) 400 MG/5ML suspension Take 15 mLs by mouth daily as needed for mild constipation. 11/12/15   Hedy Jacob, PA-C  ondansetron (ZOFRAN) 4 MG tablet Take 1 tablet (4 mg total) by mouth every 8 (eight) hours as needed for nausea or vomiting. 11/12/15   Hedy Jacob, PA-C  propranolol (INDERAL) 20 MG tablet Take one tablet daily as needed for palpitations/anxiety 03/19/15   Historical Provider, MD  traMADol (ULTRAM) 50 MG tablet Take 50 mg by mouth every 6 (six) hours as needed.    Historical Provider, MD     Family History  Problem Relation Age of Onset  . Hypertension Mother   . Alcohol abuse Brother   . Diabetes Brother   . Drug abuse Brother   . Alcohol abuse Sister   . Hypertension Sister   . Depression Sister   . Hypertension Sister   . Drug abuse Sister   . Alcohol abuse Brother   . Depression Brother   . Drug abuse Brother   . Alcohol abuse Brother   . Drug abuse Brother     Social History   Social History  . Marital Status: Married    Spouse Name: N/A  . Number of Children: 1  . Years of Education: 20   Occupational History  . program director---[employer]    Social History Main Topics  . Smoking status: Never Smoker   . Smokeless tobacco: Never Used  . Alcohol Use: No  . Drug Use: No  . Sexual Activity:  Partners: Male   Other Topics Concern  . Not on file   Social History Narrative     Review of Systems: A 12 point ROS discussed and pertinent positives are indicated in the HPI above.  All other systems are negative.  Review of Systems  Vital Signs: There were no vitals taken for this visit.  Physical Exam  Constitutional: She appears well-developed and well-nourished.  Musculoskeletal:  Examination of the right groin demonstrates no evidence of mass or bruising. Pulses are intact distally.    Mallampati Score:     Imaging: No results found.  Labs:  CBC:  Recent  Labs  11/11/15 0805  WBC 6.0  HGB 12.5  HCT 39.9  PLT 393    COAGS:  Recent Labs  11/11/15 0805  INR 0.99    BMP:  Recent Labs  11/11/15 0805  NA 140  K 4.1  CL 104  CO2 27  GLUCOSE 105*  BUN 11  CALCIUM 9.4  CREATININE 0.83  GFRNONAA >60  GFRAA >60    LIVER FUNCTION TESTS: No results for input(s): BILITOT, AST, ALT, ALKPHOS, PROT, ALBUMIN in the last 8760 hours.  TUMOR MARKERS: No results for input(s): AFPTM, CEA, CA199, CHROMGRNA in the last 8760 hours.  Assessment and Plan:  Mr. Slesinski has done very well after uterine artery embolization. She has had no complication. She was reassured that continued shrinkage of the fibroids would occur. We will contact her in 2 months and see her in 5 months in follow-up.  Thank you for this interesting consult.  I greatly enjoyed meeting Janet Parsons and look forward to participating in their care.  A copy of this report was sent to the requesting provider on this date.  Electronically Signed: Angie Hogg, ART A 12/15/2015, 3:01 PM   I spent a total of  15 Minutes in face to face in clinical consultation, greater than 50% of which was counseling/coordinating care for uterine fibroid embolization.

## 2016-02-10 ENCOUNTER — Telehealth: Payer: Self-pay | Admitting: Radiology

## 2016-02-10 NOTE — Telephone Encounter (Signed)
3 mo s/p [place] call for update.  Left message on patient's voice mail requesting a return call.  Niyah Mamaril Riki Rusk, RN 02/10/2016 2:16 PM

## 2016-02-16 ENCOUNTER — Telehealth: Payer: Self-pay | Admitting: Radiology

## 2016-02-16 NOTE — Telephone Encounter (Signed)
Returning patient's call.  Left message on voice mail.  Damaris Geers Tensed, RN 02/16/2016 11:11 AM

## 2016-03-08 ENCOUNTER — Telehealth: Payer: Self-pay | Admitting: Radiology

## 2016-03-08 NOTE — Telephone Encounter (Addendum)
Patient called for 4 mo s/p [place] update.  Patient reports that she did not have a menstrual cycle for approx 2 mos post [place].  LMP:  02/24/2016.  Decreased flow.  Some cramping with cycle. Bulk Sx:  Bloating, increased w/ cycle.    Patient prefers to schedule follow up at 6 mo s/p [place].  To be scheduled for June 2017.  The following has bee faxed to Dr. Rolan Lipa per patient request:  MR report (10/22/2015); [place] report (11/11/2015), 1 mo follow up appointment note (12/15/2015).  Sovereign Ramiro Riki Rusk, RN 03/08/2016 10:26 AM

## 2016-04-14 ENCOUNTER — Other Ambulatory Visit (HOSPITAL_COMMUNITY): Payer: Self-pay | Admitting: Interventional Radiology

## 2016-04-14 DIAGNOSIS — D259 Leiomyoma of uterus, unspecified: Secondary | ICD-10-CM

## 2016-05-03 ENCOUNTER — Encounter: Payer: Self-pay | Admitting: Radiology

## 2016-09-16 ENCOUNTER — Other Ambulatory Visit (HOSPITAL_COMMUNITY): Payer: Self-pay | Admitting: Interventional Radiology

## 2016-09-16 DIAGNOSIS — D259 Leiomyoma of uterus, unspecified: Secondary | ICD-10-CM

## 2016-10-13 ENCOUNTER — Ambulatory Visit (HOSPITAL_COMMUNITY)
Admission: RE | Admit: 2016-10-13 | Discharge: 2016-10-13 | Disposition: A | Discharge status: Home / Self Care | Payer: BLUE CROSS/BLUE SHIELD | Source: Ambulatory Visit | Attending: Interventional Radiology | Admitting: Interventional Radiology

## 2016-10-13 ENCOUNTER — Encounter (HOSPITAL_COMMUNITY): Payer: Self-pay

## 2016-10-20 ENCOUNTER — Inpatient Hospital Stay: Admission: RE | Admit: 2016-10-20 | Payer: BLUE CROSS/BLUE SHIELD | Source: Ambulatory Visit

## 2016-11-18 ENCOUNTER — Encounter: Payer: Self-pay | Admitting: Interventional Radiology

## 2017-02-25 IMAGING — MR MR PELVIS WO/W CM
4 of 9 series · 19 of 48 positions shown · IV contrast (Yes)
Comparison: Pelvic ultrasound dated 05/22/2015

CLINICAL DATA: Uterine fibroids

EXAM:
MRI PELVIS WITHOUT AND WITH CONTRAST
TECHNIQUE: Multiplanar multisequence MR imaging of the pelvis was performed
both before and after administration of intravenous contrast.
CONTRAST:  18mL MULTIHANCE GADOBENATE DIMEGLUMINE 529 MG/ML IV SOLN

[Series 3: T2 · sagittal · 5.0mm · 0.47mm/px · 5 of 23 slices shown (1 of 3)]
[im 1/23]
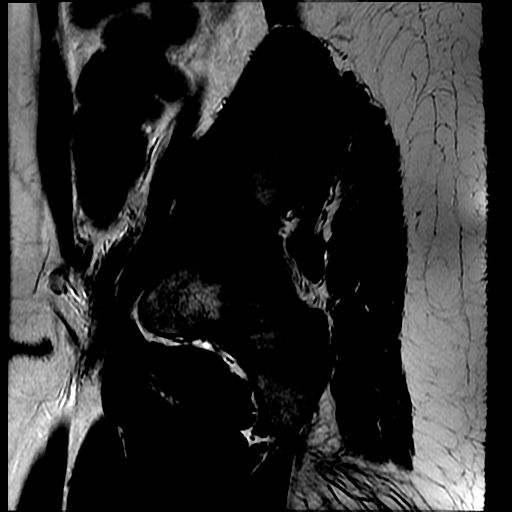
[im 6/23]
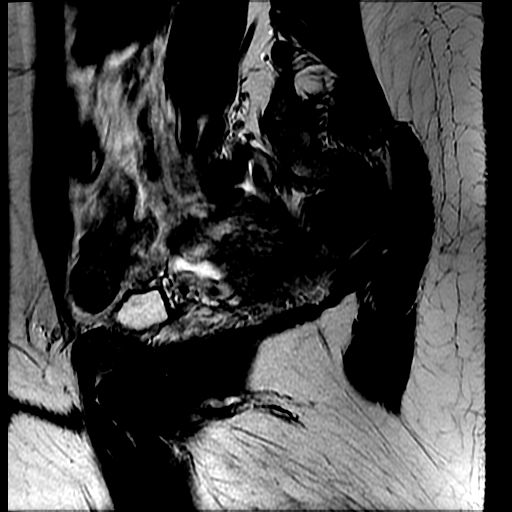
[im 12/23]
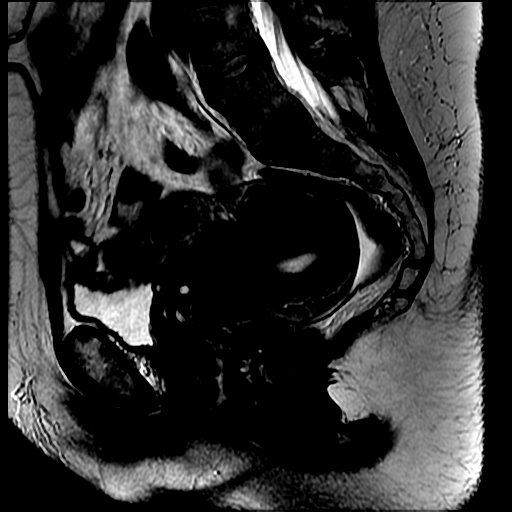
[im 17/23]
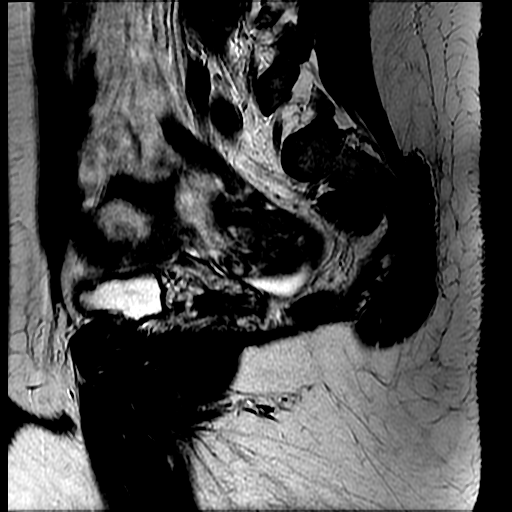
[im 23/23]
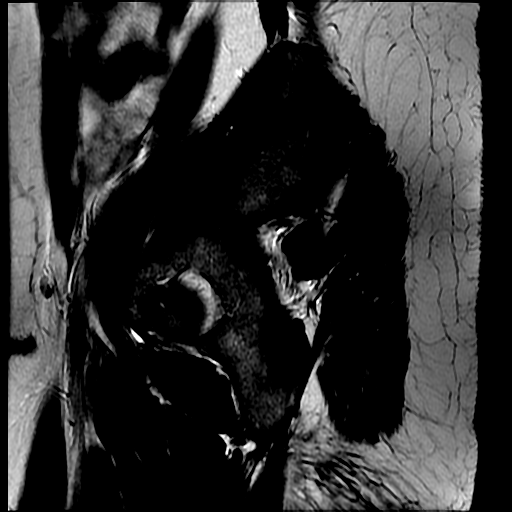

[Series 4: T1 · sagittal · 5.0mm · 0.47mm/px · 5 of 23 slices shown]
[im 1/23]
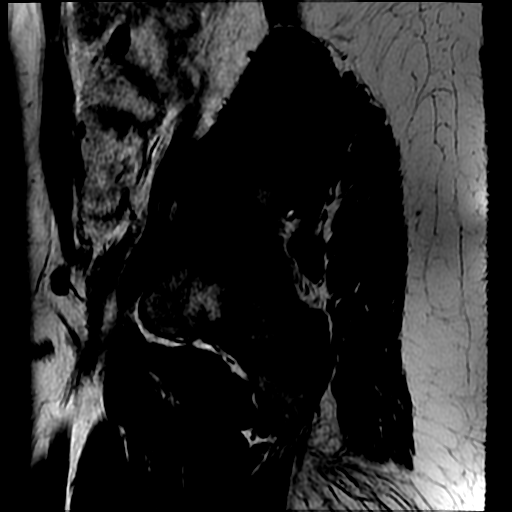
[im 6/23]
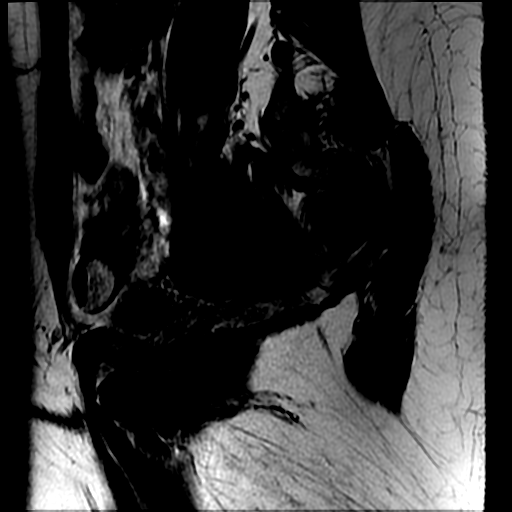
[im 12/23]
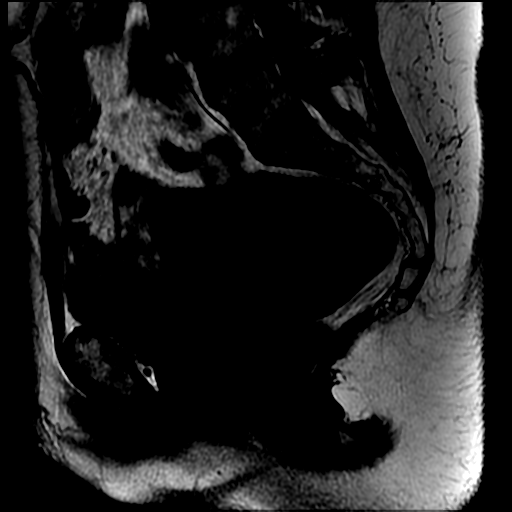
[im 17/23]
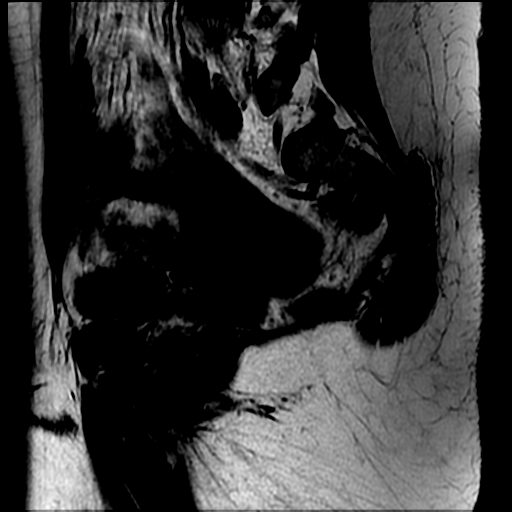
[im 23/23]
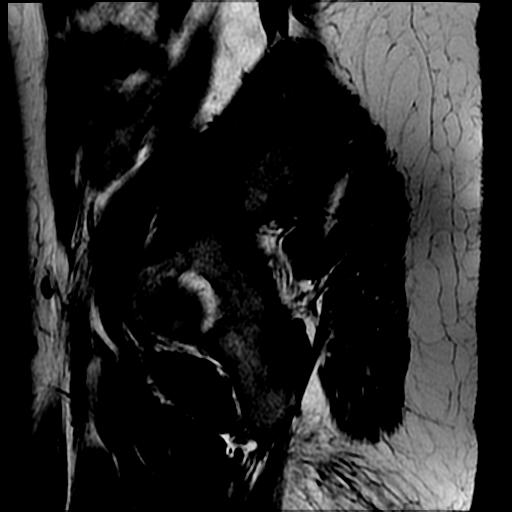

[Series 5: T2 · axial · 5.0mm · 0.47mm/px · z∈[-118,+98]mm · 6 of 37 slices shown (2 of 3)]
[im 1/37]
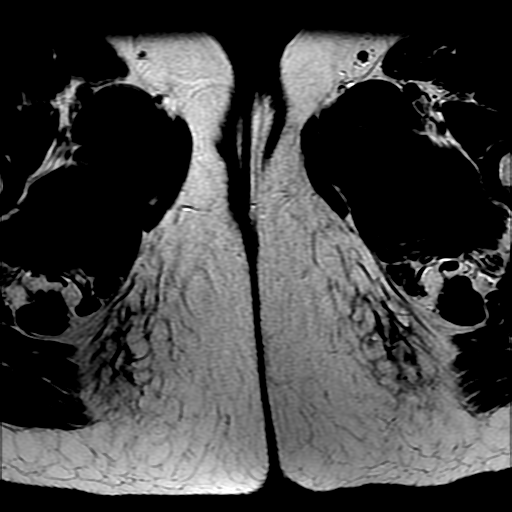
[im 8/37]
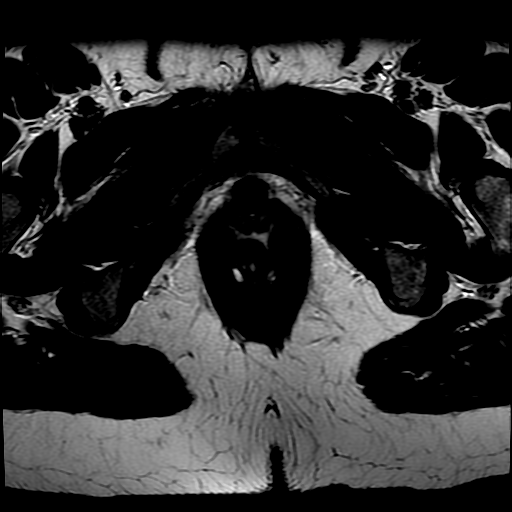
[im 15/37]
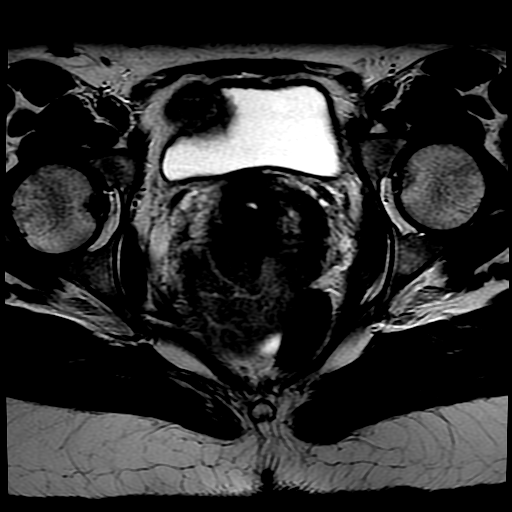
[im 22/37]
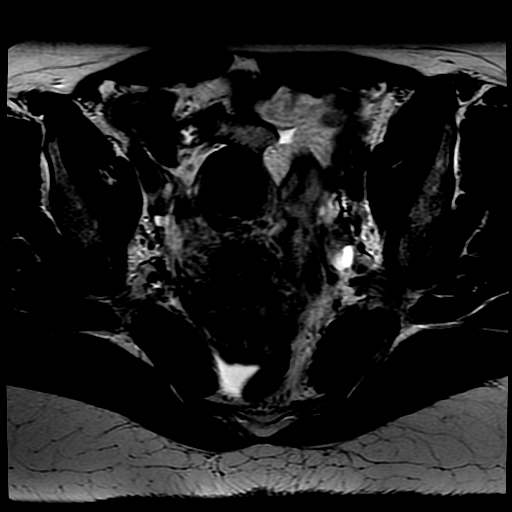
[im 29/37]
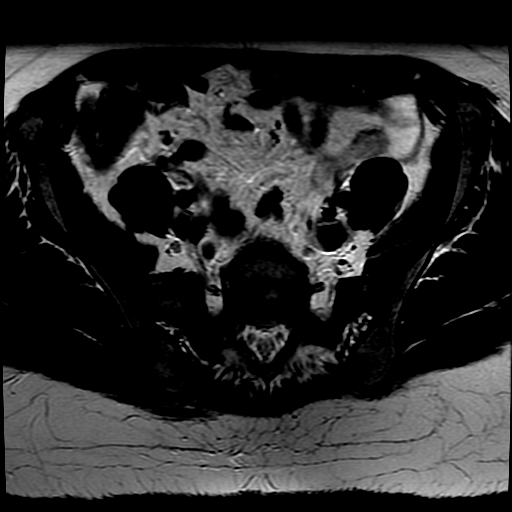
[im 37/37]
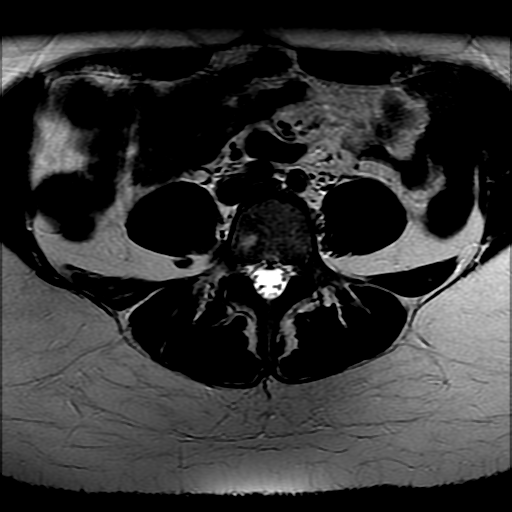

[Series 6: T2 · coronal · 5.0mm · 0.47mm/px · 3 of 32 slices shown (3 of 3)]
[im 1/32]
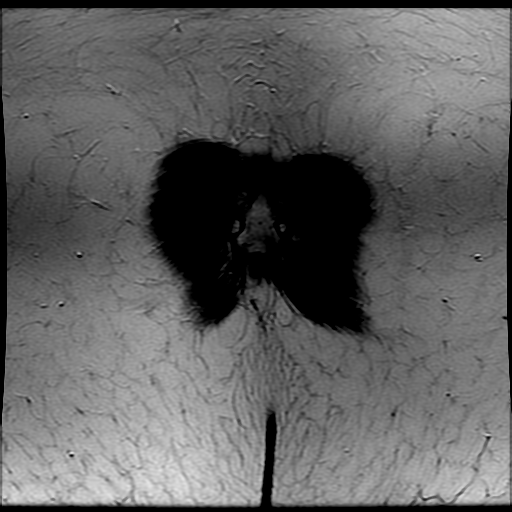
[im 16/32]
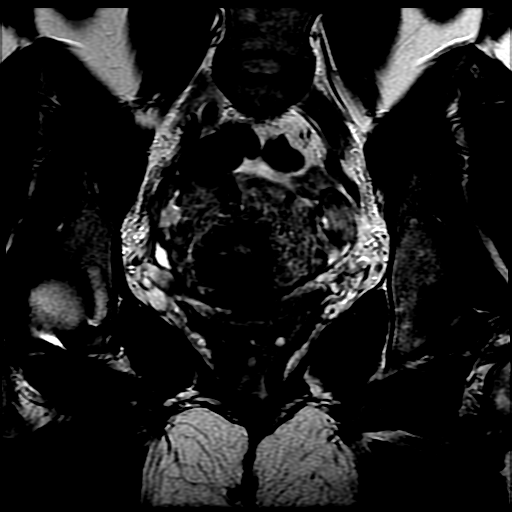
[im 32/32]
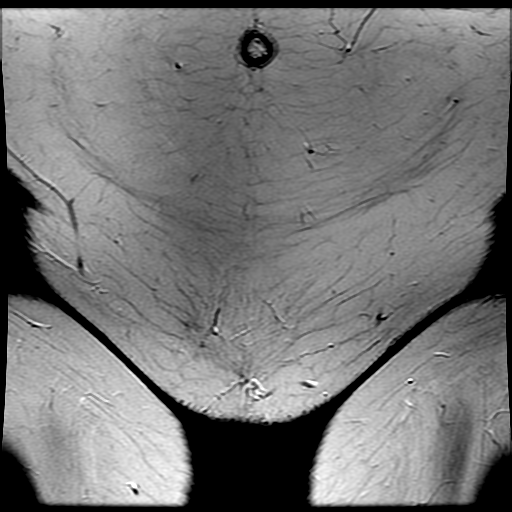

[19 of 48 positions shown; findings below may reference images not displayed]

FINDINGS: Retroverted uterus, measuring 12.7 x 7.1 x 8.0 cm. Endometrial
complex measures 9 mm. Multiple uterine fibroids, approximately 8-10
in number. Dominant fibroids include:

--2.3 x 3.8 x 2.2 cm pedunculated fibroid along the right anterior
uterine body (series 5/image 12)

--2.9 x 3.2 x 2.6 cm subserosal fibroid along the right anterior
lower uterine segment (series 5/image 16)

--1.8 x 1.1 x 1.5 cm intramural fibroid in the left anterior uterine
body (series 5/ image 16)

--1.4 x 1.7 x 1.3 cm submucosal fibroid in the right posterior
uterine body (series 5/image 20)

Right ovary measures 2.4 x 1.4 x 2.1 cm and is within normal limits.

Left ovary measures 2.2 x 1.6 x 2.6 cm and is within normal limits.

Small volume pelvic ascites.

No suspicious pelvic lymphadenopathy.

Bladder is within normal limits.

No focal osseous lesions.
IMPRESSION: Multiple uterine fibroids, measuring up to 3.8 cm, as above.

## 2019-12-30 ENCOUNTER — Ambulatory Visit: Payer: BLUE CROSS/BLUE SHIELD

## 2020-01-03 ENCOUNTER — Ambulatory Visit: Payer: BLUE CROSS/BLUE SHIELD

## 2020-01-05 ENCOUNTER — Ambulatory Visit: Payer: BLUE CROSS/BLUE SHIELD | Attending: Internal Medicine

## 2020-01-05 DIAGNOSIS — Z23 Encounter for immunization: Secondary | ICD-10-CM

## 2020-01-05 NOTE — Progress Notes (Signed)
   Covid-19 Vaccination Clinic  Name:  Janet Parsons    MRN: [id number] DOB: 1971-04-08  01/05/2020  [name] was observed post Covid-19 immunization for 15 minutes without incidence. She was provided with Vaccine Information Sheet and instruction to access the V-Safe system.   Ms. Hook was instructed to call 911 with any severe reactions post vaccine: Marland Kitchen Difficulty breathing  . Swelling of your face and throat  . A fast heartbeat  . A bad rash all over your body  . Dizziness and weakness    Immunizations Administered    Name Date Dose VIS Date Route   Pfizer COVID-19 Vaccine 01/05/2020  9:21 AM 0.3 mL 10/25/2019 Intramuscular   Manufacturer: [place]   Lot: [id number]   [place]: [id number]

## 2020-01-29 ENCOUNTER — Ambulatory Visit: Payer: BLUE CROSS/BLUE SHIELD | Attending: Internal Medicine

## 2020-01-29 DIAGNOSIS — Z23 Encounter for immunization: Secondary | ICD-10-CM

## 2020-01-29 NOTE — Progress Notes (Signed)
   Covid-19 Vaccination Clinic  Name:  Janet Parsons    MRN: [id number] DOB: 17-Dec-1970  01/29/2020  Janet Parsons was observed post Covid-19 immunization for 30 minutes based on pre-vaccination screening without incident. She was provided with Vaccine Information Sheet and instruction to access the V-Safe system.   Janet Parsons was instructed to call 911 with any severe reactions post vaccine: Marland Kitchen Difficulty breathing  . Swelling of face and throat  . A fast heartbeat  . A bad rash all over body  . Dizziness and weakness   Immunizations Administered    Name Date Dose VIS Date Route   Pfizer COVID-19 Vaccine 01/29/2020  3:12 PM 0.3 mL 10/25/2019 Intramuscular   Manufacturer: [place]   Lot: [id number]   [place]: [id number]

## 2022-12-23 ENCOUNTER — Emergency Department (HOSPITAL_BASED_OUTPATIENT_CLINIC_OR_DEPARTMENT_OTHER)
Admission: EM | Admit: 2022-12-23 | Discharge: 2022-12-23 | Disposition: A | Discharge status: Home / Self Care | Payer: Federal, State, Local not specified - PPO | Attending: Emergency Medicine | Admitting: Emergency Medicine

## 2022-12-23 ENCOUNTER — Other Ambulatory Visit: Payer: Self-pay

## 2022-12-23 ENCOUNTER — Emergency Department (HOSPITAL_BASED_OUTPATIENT_CLINIC_OR_DEPARTMENT_OTHER): Payer: Federal, State, Local not specified - PPO

## 2022-12-23 ENCOUNTER — Other Ambulatory Visit (HOSPITAL_BASED_OUTPATIENT_CLINIC_OR_DEPARTMENT_OTHER): Payer: Self-pay

## 2022-12-23 DIAGNOSIS — R0789 Other chest pain: Secondary | ICD-10-CM | POA: Diagnosis not present

## 2022-12-23 DIAGNOSIS — Z79899 Other long term (current) drug therapy: Secondary | ICD-10-CM | POA: Diagnosis not present

## 2022-12-23 DIAGNOSIS — R0602 Shortness of breath: Secondary | ICD-10-CM | POA: Diagnosis not present

## 2022-12-23 DIAGNOSIS — I10 Essential (primary) hypertension: Secondary | ICD-10-CM | POA: Insufficient documentation

## 2022-12-23 DIAGNOSIS — R079 Chest pain, unspecified: Secondary | ICD-10-CM | POA: Diagnosis present

## 2022-12-23 LAB — CBC WITH DIFFERENTIAL/PLATELET
Abs Immature Granulocytes: 0.01 10*3/uL (ref 0.00–0.07)
Basophils Absolute: 0 10*3/uL (ref 0.0–0.1)
Basophils Relative: 1 %
Eosinophils Absolute: 0.1 10*3/uL (ref 0.0–0.5)
Eosinophils Relative: 2 %
HCT: 44.5 % (ref 36.0–46.0)
Hemoglobin: 13.9 g/dL (ref 12.0–15.0)
Immature Granulocytes: 0 %
Lymphocytes Relative: 41 %
Lymphs Abs: 2.2 10*3/uL (ref 0.7–4.0)
MCH: 25.7 pg — ABNORMAL LOW (ref 26.0–34.0)
MCHC: 31.2 g/dL (ref 30.0–36.0)
MCV: 82.4 fL (ref 80.0–100.0)
Monocytes Absolute: 0.3 10*3/uL (ref 0.1–1.0)
Monocytes Relative: 5 %
Neutro Abs: 2.8 10*3/uL (ref 1.7–7.7)
Neutrophils Relative %: 51 %
Platelets: 366 10*3/uL (ref 150–400)
RBC: 5.4 MIL/uL — ABNORMAL HIGH (ref 3.87–5.11)
RDW: 13.7 % (ref 11.5–15.5)
WBC: 5.4 10*3/uL (ref 4.0–10.5)
nRBC: 0 % (ref 0.0–0.2)

## 2022-12-23 LAB — COMPREHENSIVE METABOLIC PANEL
ALT: 6 U/L (ref 0–44)
AST: 17 U/L (ref 15–41)
Albumin: 4.8 g/dL (ref 3.5–5.0)
Alkaline Phosphatase: 54 U/L (ref 38–126)
Anion gap: 10 (ref 5–15)
BUN: 9 mg/dL (ref 6–20)
CO2: 26 mmol/L (ref 22–32)
Calcium: 10.7 mg/dL — ABNORMAL HIGH (ref 8.9–10.3)
Chloride: 104 mmol/L (ref 98–111)
Creatinine, Ser: 0.83 mg/dL (ref 0.44–1.00)
GFR, Estimated: >60 mL/min (ref >60)
Glucose, Bld: 82 mg/dL (ref 70–99)
Potassium: 3.5 mmol/L (ref 3.5–5.1)
Sodium: 140 mmol/L (ref 135–145)
Total Bilirubin: 0.7 mg/dL (ref 0.3–1.2)
Total Protein: 8.9 g/dL — ABNORMAL HIGH (ref 6.5–8.1)

## 2022-12-23 LAB — TROPONIN I (HIGH SENSITIVITY): Troponin I (High Sensitivity): <2 ng/L (ref <18)

## 2022-12-23 NOTE — Discharge Instructions (Signed)
You have been seen today for your complaint of chest pain. Your lab work was reassuring and showed no abnormalities. Your imaging was reassuring and showed abnormalities. Your discharge medications include your home medications.  You should switch to half dose of your new antihypertensive like your primary care provider suggested. Home care instructions are as follows:  Make efforts to avoid stress at work Follow up with: Your primary care provider as scheduled Please seek immediate medical care if you develop any of the following symptoms: Your chest pain gets worse. You have a cough that gets worse, or you cough up blood. You have severe pain in your abdomen. You faint. You have sudden, unexplained chest discomfort. You have sudden, unexplained discomfort in your arms, back, neck, or jaw. You have shortness of breath at any time. You suddenly start to sweat, or your skin gets clammy. You feel nausea or you vomit. You suddenly feel lightheaded or dizzy. You have severe weakness, or unexplained weakness or fatigue. Your heart begins to beat quickly, or it feels like it is skipping beats. At this time there does not appear to be the presence of an emergent medical condition, however there is always the potential for conditions to change. Please read and follow the below instructions.  Do not take your medicine if  develop an itchy rash, swelling in your mouth or lips, or difficulty breathing; call 911 and seek immediate emergency medical attention if this occurs.  You may review your lab tests and imaging results in their entirety on your MyChart account.  Please discuss all results of fully with your primary care provider and other specialist at your follow-up visit.  Note: Portions of this text may have been transcribed using voice recognition software. Every effort was made to ensure accuracy; however, inadvertent computerized transcription errors may still be present.

## 2022-12-23 NOTE — ED Notes (Signed)
Discharge instructions, medications, and follow up care reviewed and explained. Pt verbalized understanding and had no further questions on d/c. Pt caox4, ambulatory, NAD on d/c.

## 2022-12-23 NOTE — ED Provider Notes (Signed)
[facility] Provider Note   CSN: [id number] Arrival date & time: 12/23/22  1229     History  Chief Complaint  Patient presents with   Chest Pain    Janet Parsons is a 52 y.o. female.  With a history of anxiety, depression, bipolar 2, hypertension who presents to the ED for evaluation of left-sided chest pain and bilateral arm numbness and tingling.  She states that this began this morning shortly after work and up.  She was started on losartan 25 mg by her primary care provider 2 days ago and states that she did not know that she was supposed to take half of a tablet at first so she has been taking a full tablet.  She states she has severe anxiety for which she takes BuSpar and propranolol.  She believes this may be contributing to her symptoms.  She initially presented to primary care but was found to have significant hypertension and encouraged to come to the ED for further evaluation.  She states her blood pressure is typically 130/100 and has been like that for years.  This is why she was started on losartan recently.  It is not exertional.  She states that her chest pain has actually resolved on my initial evaluation.  She still feels the tingling in her hands.  She denies estrogen use, history of DVT or PE, recent surgery, hemoptysis, unilateral leg swelling.  She feels minimal shortness of breath.   Chest Pain Associated symptoms: shortness of breath        Home Medications Prior to Admission medications   Medication Sig Start Date End Date Taking? Authorizing Provider  chlorhexidine (PERIDEX) 0.12 % solution SWISH WITH 1/2 CAPFUL BEFORE BEDTIME AND EXPECTORATE. START 24 H AFTER SURGERY 10/21/15   [provider]  docusate sodium (COLACE) 100 MG capsule Take 1 capsule (100 mg total) by mouth 2 (two) times daily. 11/12/15   Tsosie Billing D, PA-C  doxycycline (VIBRAMYCIN) 100 MG capsule Take 1 capsule by mouth daily. 11/04/15    [provider]  HYDROcodone-acetaminophen (NORCO/VICODIN) 5-325 MG tablet Take 1-2 tablets by mouth every 4 (four) hours as needed for moderate pain. 11/12/15   Tsosie Billing D, PA-C  ibuprofen (ADVIL,MOTRIN) 600 MG tablet Take 1 tablet (600 mg total) by mouth every 6 (six) hours as needed for moderate pain or cramping. 11/12/15   Tsosie Billing D, PA-C  magnesium hydroxide (MILK OF MAGNESIA) 400 MG/5ML suspension Take 15 mLs by mouth daily as needed for mild constipation. 11/12/15   Tsosie Billing D, PA-C  ondansetron (ZOFRAN) 4 MG tablet Take 1 tablet (4 mg total) by mouth every 8 (eight) hours as needed for nausea or vomiting. 11/12/15   Tsosie Billing D, PA-C  propranolol (INDERAL) 20 MG tablet Take one tablet daily as needed for palpitations/anxiety 03/19/15   [provider]  traMADol (ULTRAM) 50 MG tablet Take 50 mg by mouth every 6 (six) hours as needed.    [provider]      Allergies    Vicodin [hydrocodone-acetaminophen]    Review of Systems   Review of Systems  Respiratory:  Positive for shortness of breath.   Cardiovascular:  Positive for chest pain.  All other systems reviewed and are negative.   Physical Exam Updated Vital Signs BP (!) 152/115   Pulse 72   Temp 98 F (36.7 C) (Oral)   Resp 18   Ht 5' 4"$  (1.626 m)   Wt 83.9  kg   LMP  (Approximate)   SpO2 98%   BMI 31.76 kg/m  Physical Exam Vitals and nursing note reviewed.  Constitutional:      General: She is not in acute distress.    Appearance: She is well-developed.     Comments: Resting comfortably in bed  HENT:     Head: Normocephalic and atraumatic.  Eyes:     Conjunctiva/sclera: Conjunctivae normal.  Cardiovascular:     Rate and Rhythm: Normal rate and regular rhythm.     Pulses:          Radial pulses are 2+ on the right side and 2+ on the left side.     Heart sounds: No murmur heard. Pulmonary:     Effort: Pulmonary effort is normal. No respiratory distress.      Breath sounds: Normal breath sounds. No decreased breath sounds, wheezing, rhonchi or rales.  Abdominal:     Palpations: Abdomen is soft.     Tenderness: There is no abdominal tenderness.  Musculoskeletal:        General: No swelling.     Cervical back: Neck supple.     Right lower leg: No edema.     Left lower leg: No edema.  Skin:    General: Skin is warm and dry.     Capillary Refill: Capillary refill takes less than 2 seconds.  Neurological:     General: No focal deficit present.     Mental Status: She is alert and oriented to person, place, and time.  Psychiatric:        Mood and Affect: Mood normal.     ED Results / Procedures / Treatments   Labs (all labs ordered are listed, but only abnormal results are displayed) Labs Reviewed  CBC WITH DIFFERENTIAL/PLATELET - Abnormal; Notable for the following components:      Result Value   RBC 5.40 (*)    MCH 25.7 (*)    All other components within normal limits  COMPREHENSIVE METABOLIC PANEL - Abnormal; Notable for the following components:   Calcium 10.7 (*)    Total Protein 8.9 (*)    All other components within normal limits  TROPONIN I (HIGH SENSITIVITY)    EKG EKG Interpretation  Date/Time:  Friday December 23 2022 12:39:17 EST Ventricular Rate:  71 PR Interval:  179 QRS Duration: 88 QT Interval:  399 QTC Calculation: 434 R Axis:   35 Text Interpretation: Sinus rhythm Confirmed by Lennice Sites ([id number]) on 12/23/2022 12:40:15 PM  Radiology DG Chest Portable 1 View  Result Date: 12/23/2022 CLINICAL DATA:  Chest pain and shortness of breath. EXAM: PORTABLE CHEST 1 VIEW COMPARISON:  None Available. FINDINGS: Cardiac silhouette and mediastinal contours are within normal limits. The lungs are clear. No pleural effusion or pneumothorax. No acute skeletal abnormality. IMPRESSION: No active disease. Electronically Signed   By: Yvonne Kendall M.D.   On: 12/23/2022 13:28    Procedures Procedures    Medications Ordered in  ED Medications - No data to display  ED Course/ Medical Decision Making/ A&P             HEART Score: 2                Medical Decision Making Amount and/or Complexity of Data Reviewed Labs: ordered. Radiology: ordered.  This patient presents to the ED for concern of chest pain, this involves an extensive number of treatment options, and is a complaint that carries with it a high risk of  complications and morbidity.  The emergent differential diagnosis of chest pain includes: Acute coronary syndrome, pericarditis, aortic dissection, pulmonary embolism, tension pneumothorax, and esophageal rupture.  I do not believe the patient has an emergent cause of chest pain, other urgent/non-acute considerations include, but are not limited to: chronic angina, aortic stenosis, cardiomyopathy, myocarditis, mitral valve prolapse, pulmonary hypertension, hypertrophic obstructive cardiomyopathy (HOCM), aortic insufficiency, right ventricular hypertrophy, pneumonia, pleuritis, bronchitis, pneumothorax, tumor, gastroesophageal reflux disease (GERD), esophageal spasm, Mallory-Weiss syndrome, peptic ulcer disease, biliary disease, pancreatitis, functional gastrointestinal pain, cervical or thoracic disk disease or arthritis, shoulder arthritis, costochondritis, subacromial bursitis, anxiety or panic attack, herpes zoster, breast disorders, chest wall tumors, thoracic outlet syndrome, mediastinitis.  Co morbidities that complicate the patient evaluation   anxiety, depression, uncontrolled hypertension  My initial workup includes ACS rule out  Additional history obtained from: Nursing notes from this visit. Previous records within EMR system primary care visit on 12/21/2022  I ordered, reviewed and interpreted labs which include: BMP, CBC, troponin.  CMP shows slight hypercalcemia.  Labs otherwise unremarkable.  Troponin negative  I ordered imaging studies including chest x-ray I independently visualized and  interpreted imaging which showed normal I agree with the radiologist interpretation  Cardiac Monitoring:  The patient was maintained on a cardiac monitor.  I personally viewed and interpreted the cardiac monitored which showed an underlying rhythm of: NSR  Afebrile, hypertensive at baseline but otherwise hemodynamically stable.  52 year old female presenting to the ED for evaluation of chest pain and tingling.  She believes this is secondary to an anxiety attack versus side effect of her losartan which she started yesterday.  On exam, patient appears slightly anxious but is otherwise in no acute distress and exam is reassuring.  Her lab workup was reassuring.  Her initial troponin is negative.  EKG shows no ischemic changes.  Chest x-ray unremarkable.  Heart score of 2.  Low suspicion for ACS.  PERC negative low suspicion for PE.  Her symptoms may have been secondary to an anxiety attack as she describes significant increase in life stressors recently.  She has also been taking double her dose of losartan since yesterday.  This may also be contributing.  Patient did reach out to her primary care provider today.  Encouraged her to only take half the dose as prescribed for at least 1 week.  Her symptoms had completely resolved prior to discharge.  Patient was given strict return precautions.  Stable at discharge.  At this time there does not appear to be any evidence of an acute emergency medical condition and the patient appears stable for discharge with appropriate outpatient follow up. Diagnosis was discussed with patient who verbalizes understanding of care plan and is agreeable to discharge. I have discussed return precautions with patient who verbalizes understanding. Patient encouraged to follow-up with their PCP within 1 week. All questions answered.  Patient's case discussed with Dr. Ronnald Nian who agrees with plan to discharge with follow-up.   Note: Portions of this report may have been  transcribed using voice recognition software. Every effort was made to ensure accuracy; however, inadvertent computerized transcription errors may still be present.        Final Clinical Impression(s) / ED Diagnoses Final diagnoses:  Atypical chest pain    Rx / DC Orders ED Discharge Orders     None         Roylene Reason, PA-C 12/23/22 1700    Lennice Sites, DO 12/23/22 1823

## 2022-12-23 NOTE — ED Triage Notes (Signed)
Pt arrived POV. Pt c/o chest tightness and SOB that started this morning while at work. Pt also c/o L arm tingling that she reports is ongoing and intermittent but has been more severe today. Pt was started on Losartan 2 days ago.

## 2023-05-13 ENCOUNTER — Other Ambulatory Visit: Payer: Self-pay | Admitting: *Deleted

## 2023-05-13 DIAGNOSIS — Z1211 Encounter for screening for malignant neoplasm of colon: Secondary | ICD-10-CM

## 2023-05-13 NOTE — Progress Notes (Signed)
FIT Test given to patient to complete. See colorectal screening flowsheet.
